# Patient Record
Sex: Female | Born: 1950 | Race: Black or African American | Hispanic: No | Marital: Single | Smoking: Never smoker
Health system: Southern US, Community
[De-identification: ages and names within clinical notes are randomized; demographics above are authoritative.]

## PROBLEM LIST (undated history)

## (undated) DIAGNOSIS — E785 Hyperlipidemia, unspecified: Secondary | ICD-10-CM

## (undated) DIAGNOSIS — M549 Dorsalgia, unspecified: Secondary | ICD-10-CM

## (undated) DIAGNOSIS — G8929 Other chronic pain: Secondary | ICD-10-CM

## (undated) HISTORY — DX: Hyperlipidemia, unspecified: E78.5

---

## 1998-07-14 ENCOUNTER — Inpatient Hospital Stay (HOSPITAL_COMMUNITY): Admission: RE | Admit: 1998-07-14 | Discharge: 1998-07-16 | Payer: Self-pay | Admitting: *Deleted

## 2001-01-12 ENCOUNTER — Encounter: Payer: Self-pay | Admitting: Orthopedic Surgery

## 2001-01-12 ENCOUNTER — Ambulatory Visit (HOSPITAL_COMMUNITY): Admission: RE | Admit: 2001-01-12 | Discharge: 2001-01-12 | Payer: Self-pay | Admitting: Orthopedic Surgery

## 2001-08-22 ENCOUNTER — Encounter: Admission: RE | Admit: 2001-08-22 | Discharge: 2001-08-22 | Payer: Self-pay | Admitting: Orthopedic Surgery

## 2001-08-22 ENCOUNTER — Encounter: Payer: Self-pay | Admitting: Orthopedic Surgery

## 2001-09-03 ENCOUNTER — Encounter: Payer: Self-pay | Admitting: Orthopedic Surgery

## 2001-09-03 ENCOUNTER — Encounter: Admission: RE | Admit: 2001-09-03 | Discharge: 2001-09-03 | Payer: Self-pay | Admitting: Orthopedic Surgery

## 2001-09-16 ENCOUNTER — Encounter: Admission: RE | Admit: 2001-09-16 | Discharge: 2001-09-16 | Payer: Self-pay | Admitting: Orthopedic Surgery

## 2001-09-16 ENCOUNTER — Encounter: Payer: Self-pay | Admitting: Orthopedic Surgery

## 2001-10-29 ENCOUNTER — Encounter: Payer: Self-pay | Admitting: Orthopedic Surgery

## 2001-10-29 ENCOUNTER — Ambulatory Visit (HOSPITAL_COMMUNITY): Admission: RE | Admit: 2001-10-29 | Discharge: 2001-10-29 | Payer: Self-pay | Admitting: Orthopedic Surgery

## 2003-04-10 ENCOUNTER — Ambulatory Visit (HOSPITAL_COMMUNITY): Admission: RE | Admit: 2003-04-10 | Discharge: 2003-04-10 | Payer: Self-pay | Admitting: Orthopedic Surgery

## 2005-07-18 ENCOUNTER — Encounter: Admission: RE | Admit: 2005-07-18 | Discharge: 2005-07-18 | Payer: Self-pay | Admitting: Orthopedic Surgery

## 2010-09-12 ENCOUNTER — Encounter: Admission: RE | Admit: 2010-09-12 | Discharge: 2010-09-12 | Payer: Self-pay | Admitting: Orthopedic Surgery

## 2011-01-15 ENCOUNTER — Encounter: Payer: Self-pay | Admitting: Nurse Practitioner

## 2011-01-15 ENCOUNTER — Encounter: Payer: Self-pay | Admitting: Orthopedic Surgery

## 2011-04-04 ENCOUNTER — Other Ambulatory Visit: Payer: Self-pay | Admitting: Orthopedic Surgery

## 2011-04-04 DIAGNOSIS — M549 Dorsalgia, unspecified: Secondary | ICD-10-CM

## 2011-04-04 DIAGNOSIS — M79605 Pain in left leg: Secondary | ICD-10-CM

## 2011-04-14 ENCOUNTER — Ambulatory Visit
Admission: RE | Admit: 2011-04-14 | Discharge: 2011-04-14 | Disposition: A | Discharge status: Home / Self Care | Payer: Medicare Other | Source: Ambulatory Visit | Attending: Orthopedic Surgery | Admitting: Orthopedic Surgery

## 2011-04-14 DIAGNOSIS — M79605 Pain in left leg: Secondary | ICD-10-CM

## 2011-04-14 DIAGNOSIS — M549 Dorsalgia, unspecified: Secondary | ICD-10-CM

## 2011-05-12 NOTE — Op Note (Signed)
Sherri Jones, Sherri Jones                          ACCOUNT NO.:  [id number]   MEDICAL RECORD NO.:  [id number]                   PATIENT TYPE:  AMB   LOCATION:  DAY                                  FACILITY:  [facility]   PHYSICIAN:  Ronald A. Darrelyn Hillock, M.D.             DATE OF BIRTH:  03/13/1951   DATE OF PROCEDURE:  04/10/2003  DATE OF DISCHARGE:                                 OPERATIVE REPORT   PREOPERATIVE DIAGNOSES:  1. Partial tear of the medial meniscus -- left knee.  2. Chronic synovitis -- left knee.   POSTOPERATIVE DIAGNOSES:  1. Partial tear of the medial meniscus -- left knee.  2. Chronic synovitis -- left knee.   OPERATION:  1. Diagnostic arthroscopy -- left knee.  2. Partial medial meniscectomy -- left knee.  3. Synovectomy of the medial compartment -- left knee.  4. Synovectomy of suprapatellar compartment -- left knee.   SURGEON:  Georges Lynch. Darrelyn Hillock, M.D.   ASSISTANT:  Nurse.   DESCRIPTION OF PROCEDURE:  Under general anesthesia, routine orthopedic  prepping and draping of left lower extremity was carried out.  A small  punctate incision was made in the suprapatellar pouch; in-flow cannula was  inserted and the knee was distended with saline.  At this time another small  punctate incision was made in the anterolateral joint; the arthroscope was  entered and complete diagnostic arthroscopy was carried out.  The patient's  main problem was a severe chronic synovitis in her knee.  The second problem  was she had a vertical tear of the medial meniscus.  I introduced the shaver  suction device in the medial approach, and did a partial medial  meniscectomy.  Following that I did a synovectomy of the medial compartment,  as well as the suprapatellar pouch region.  The inferior and posterior  cruciate ligaments were intact.  The lateral meniscus was definitely intact.  I thoroughly irrigated out; the area of the joint spaces themselves did not  look bad, except for some mild  irregularities in the medial joint.  Her  problem was more of a chronic synovitis, as well as the meniscal tear.  I  thoroughly irrigated out the knee and removed the fluid.  I closed all three  punctate incisions with 3-0 nylon suture, and injected with 30 cc 0.5%  Marcaine with epinephrine into the knee joint.  Given 1 g IV Ancef  preoperatively, and during the surgery she had 30 mg of IV Toradol.  Sterile  dressings were applied.  The patient left the operating room in satisfactory  condition.   FOLLOW-UP CARE:  She will be on Mepergan Fortis at home for pain, one q.4h.  p.r.n.  She will be on Bufferin one b.i.d. as an anticoagulant.  She will be  on crutches with partial weightbearing.  I will see her in 12-14 days, or  prior to if she has any problems.  Ronald A. Darrelyn Hillock, M.D.   RAG/MEDQ  D:  04/10/2003  T:  04/10/2003  Job:  [id number]

## 2012-12-13 ENCOUNTER — Other Ambulatory Visit: Payer: Self-pay | Admitting: Orthopedic Surgery

## 2012-12-13 DIAGNOSIS — M4800 Spinal stenosis, site unspecified: Secondary | ICD-10-CM

## 2012-12-23 ENCOUNTER — Other Ambulatory Visit: Payer: Self-pay | Admitting: Orthopedic Surgery

## 2012-12-23 ENCOUNTER — Ambulatory Visit
Admission: RE | Admit: 2012-12-23 | Discharge: 2012-12-23 | Disposition: A | Discharge status: Home / Self Care | Payer: Medicare Other | Source: Ambulatory Visit | Attending: Orthopedic Surgery | Admitting: Orthopedic Surgery

## 2012-12-23 ENCOUNTER — Ambulatory Visit
Admission: RE | Admit: 2012-12-23 | Discharge: 2012-12-23 | Disposition: A | Discharge status: Home / Self Care | Payer: Self-pay | Source: Ambulatory Visit | Attending: Orthopedic Surgery | Admitting: Orthopedic Surgery

## 2012-12-23 VITALS — BP 116/72 | HR 65 | Ht 71.0 in | Wt 194.0 lb

## 2012-12-23 DIAGNOSIS — R52 Pain, unspecified: Secondary | ICD-10-CM

## 2012-12-23 DIAGNOSIS — M4800 Spinal stenosis, site unspecified: Secondary | ICD-10-CM

## 2012-12-23 MED ORDER — DIAZEPAM 5 MG PO TABS
10.0000 mg | ORAL_TABLET | Freq: Once | ORAL | Status: AC
  Administered 2012-12-23: 10 mg via ORAL

## 2012-12-23 MED ORDER — IOHEXOL 180 MG/ML  SOLN
18.0000 mL | Freq: Once | INTRAMUSCULAR | Status: AC | PRN
  Administered 2012-12-23: 18 mL via INTRATHECAL

## 2012-12-23 MED ORDER — ONDANSETRON HCL 4 MG/2ML IJ SOLN: 4.0000 mg | Freq: Four times a day (QID) | INTRAMUSCULAR | Status: DC | PRN

## 2017-03-28 ENCOUNTER — Encounter (HOSPITAL_COMMUNITY): Payer: Self-pay

## 2017-03-28 ENCOUNTER — Emergency Department (HOSPITAL_COMMUNITY)
Admission: EM | Admit: 2017-03-28 | Discharge: 2017-03-28 | Disposition: A | Discharge status: Home / Self Care | Payer: Medicare Other | Attending: Emergency Medicine | Admitting: Emergency Medicine

## 2017-03-28 DIAGNOSIS — M545 Low back pain: Secondary | ICD-10-CM | POA: Diagnosis not present

## 2017-03-28 DIAGNOSIS — M549 Dorsalgia, unspecified: Secondary | ICD-10-CM | POA: Diagnosis present

## 2017-03-28 DIAGNOSIS — M5410 Radiculopathy, site unspecified: Secondary | ICD-10-CM

## 2017-03-28 HISTORY — DX: Other chronic pain: G89.29

## 2017-03-28 HISTORY — DX: Dorsalgia, unspecified: M54.9

## 2017-03-28 MED ORDER — KETOROLAC TROMETHAMINE 60 MG/2ML IM SOLN
30.0000 mg | Freq: Once | INTRAMUSCULAR | Status: AC
  Administered 2017-03-28: 30 mg via INTRAMUSCULAR
  Filled 2017-03-28: qty 2

## 2017-03-28 MED ORDER — CYCLOBENZAPRINE HCL 10 MG PO TABS: 10.0000 mg | ORAL_TABLET | Freq: Two times a day (BID) | ORAL | 0 refills | Status: DC | PRN

## 2017-03-28 MED ORDER — PREDNISONE 20 MG PO TABS: ORAL_TABLET | ORAL | 0 refills | Status: DC

## 2017-03-28 MED ORDER — NAPROXEN 500 MG PO TABS: 500.0000 mg | ORAL_TABLET | Freq: Two times a day (BID) | ORAL | 0 refills | Status: DC

## 2017-03-28 MED ORDER — PREDNISONE 20 MG PO TABS
60.0000 mg | ORAL_TABLET | Freq: Once | ORAL | Status: AC
  Administered 2017-03-28: 60 mg via ORAL
  Filled 2017-03-28: qty 3

## 2017-03-28 NOTE — ED Notes (Signed)
Pt ambulated with slow but steady gait

## 2017-03-28 NOTE — ED Provider Notes (Signed)
[facility]-EMERGENCY DEPT Provider Note   CSN: [id number] Arrival date & time: 03/28/17  1956   By signing my name below, I, Clovis Pu, attest that this documentation has been prepared under the direction and in the presence of  Fayrene Helper, PA-C. Electronically Signed: Clovis Pu, ED Scribe. 03/28/17. 9:04 PM.   History   Chief Complaint Chief Complaint  Patient presents with  . Back Pain    HPI Comments:  Sherri Jones is a 66 y.o. female, with a PMHx of spinal stenosis, who presents to the Emergency Department complaining of chronic, recurrent, moderate back pain (R>L) which radiates to her right knee x yesterday. She states her pain worsened today. She states her pain is exacerbated with sitting down and upon palpation. She notes she experiences these symptoms about twice a month and notes she usually takes OTC pain medication for her symptoms. She notes her orthopedist has treated her, in the past, with muscle relaxer but notes they make her nauseous. Pt denies fevers, chills, bowel/bladder incontinence, difficulty urinating, dysuria, hematuria, numbness, weakness or any other associated symptoms. No other complaints noted at this time.   Orthopedist: Dr. Ranee Gosselin   The history is provided by the patient. No language interpreter was used.    Past Medical History:  Diagnosis Date  . Chronic back pain     There are no active problems to display for this patient.   History reviewed. No pertinent surgical history.  OB History    No data available       Home Medications    Prior to Admission medications   Not on File    Family History No family history on file.  Social History Social History  Substance Use Topics  . Smoking status: Never Smoker  . Smokeless tobacco: Never Used  . Alcohol use No     Allergies   Patient has no known allergies.   Review of Systems Review of Systems  Constitutional: Negative for chills and fever.  Genitourinary:  Negative for difficulty urinating, dysuria and hematuria.  Musculoskeletal: Positive for back pain and myalgias.  Neurological: Negative for weakness and numbness.     Physical Exam Updated Vital Signs BP (!) 134/99 (BP Location: Right Arm)   Pulse 84   Temp 97.7 F (36.5 C) (Oral)   Resp 16   SpO2 100%   Physical Exam  Constitutional: She is oriented to person, place, and time. She appears well-developed and well-nourished. No distress.  HENT:  Head: Normocephalic and atraumatic.  Eyes: Conjunctivae are normal.  Cardiovascular: Normal rate.   Pulmonary/Chest: Effort normal.  Abdominal: Soft. She exhibits no distension. There is no tenderness.  Musculoskeletal:  Tenderness noted to the right paral lumbar spinal muscle upon palpation. Positive right straight leg raise.   Neurological: She is alert and oriented to person, place, and time.  Skin: Skin is warm and dry.  Psychiatric: She has a normal mood and affect.  Nursing note and vitals reviewed.    ED Treatments / Results  DIAGNOSTIC STUDIES:  Oxygen Saturation is 100% on RA, normal by my interpretation.    COORDINATION OF CARE:  8:50 PM Discussed treatment plan with pt at bedside and pt agreed to plan.  Labs (all labs ordered are listed, but only abnormal results are displayed) Labs Reviewed - No data to display  EKG  EKG Interpretation None       Radiology No results found.  Procedures Procedures (including critical care time)  Medications Ordered in ED  Medications - No data to display   Initial Impression / Assessment and Plan / ED Course  I have reviewed the triage vital signs and the nursing notes.  Pertinent labs & imaging results that were available during my care of the patient were reviewed by me and considered in my medical decision making (see chart for details).     Patient with back pain.  No neurological deficits and normal neuro exam.  Patient is ambulatory.  No loss of bowel or  bladder control.  No concern for cauda equina.  No fever, night sweats, weight loss, h/o cancer, IVDA, no recent procedure to back. No urinary symptoms suggestive of UTI.  Supportive care and return precaution discussed. Appears safe for discharge at this time. Follow up as indicated in discharge paperwork.   Final Clinical Impressions(s) / ED Diagnoses   Final diagnoses:  Radicular low back pain    New Prescriptions New Prescriptions   CYCLOBENZAPRINE (FLEXERIL) 10 MG TABLET    Take 1 tablet (10 mg total) by mouth 2 (two) times daily as needed for muscle spasms.   NAPROXEN (NAPROSYN) 500 MG TABLET    Take 1 tablet (500 mg total) by mouth 2 (two) times daily.   PREDNISONE (DELTASONE) 20 MG TABLET    2 tabs po daily x 4 days   I personally performed the services described in this documentation, which was scribed in my presence. The recorded information has been reviewed and is accurate.       Fayrene Helper, PA-C 03/28/17 2139    Nira Conn, MD 03/29/17 [phone]

## 2017-03-28 NOTE — ED Triage Notes (Signed)
Pt states she has hx of back pain but Md she normally see is out of town; pt states she started having back pain yesterday but has increased; pt c/o pain at 10/10 and radiates down right leg; Pt a&ox 4 and ambulated to triage room

## 2017-06-03 ENCOUNTER — Emergency Department (HOSPITAL_COMMUNITY)
Admission: EM | Admit: 2017-06-03 | Discharge: 2017-06-03 | Disposition: A | Discharge status: Home / Self Care | Payer: Medicare Other | Attending: Emergency Medicine | Admitting: Emergency Medicine

## 2017-06-03 ENCOUNTER — Encounter (HOSPITAL_COMMUNITY): Payer: Self-pay | Admitting: *Deleted

## 2017-06-03 DIAGNOSIS — Z79899 Other long term (current) drug therapy: Secondary | ICD-10-CM | POA: Insufficient documentation

## 2017-06-03 DIAGNOSIS — Y999 Unspecified external cause status: Secondary | ICD-10-CM | POA: Insufficient documentation

## 2017-06-03 DIAGNOSIS — Y9389 Activity, other specified: Secondary | ICD-10-CM | POA: Diagnosis not present

## 2017-06-03 DIAGNOSIS — M542 Cervicalgia: Secondary | ICD-10-CM | POA: Insufficient documentation

## 2017-06-03 DIAGNOSIS — G8929 Other chronic pain: Secondary | ICD-10-CM | POA: Insufficient documentation

## 2017-06-03 DIAGNOSIS — Y92410 Unspecified street and highway as the place of occurrence of the external cause: Secondary | ICD-10-CM | POA: Diagnosis not present

## 2017-06-03 DIAGNOSIS — M549 Dorsalgia, unspecified: Secondary | ICD-10-CM | POA: Insufficient documentation

## 2017-06-03 MED ORDER — METHOCARBAMOL 500 MG PO TABS: 500.0000 mg | ORAL_TABLET | Freq: Two times a day (BID) | ORAL | 0 refills | Status: DC

## 2017-06-03 MED ORDER — KETOROLAC TROMETHAMINE 60 MG/2ML IM SOLN
60.0000 mg | Freq: Once | INTRAMUSCULAR | Status: AC
  Administered 2017-06-03: 60 mg via INTRAMUSCULAR
  Filled 2017-06-03: qty 2

## 2017-06-03 MED ORDER — METHOCARBAMOL 500 MG PO TABS
1000.0000 mg | ORAL_TABLET | Freq: Once | ORAL | Status: AC
  Administered 2017-06-03: 1000 mg via ORAL
  Filled 2017-06-03: qty 2

## 2017-06-03 MED ORDER — IBUPROFEN 600 MG PO TABS: 600.0000 mg | ORAL_TABLET | Freq: Four times a day (QID) | ORAL | 0 refills | Status: DC | PRN

## 2017-06-03 MED ORDER — LIDOCAINE 5 % EX PTCH: 1.0000 | MEDICATED_PATCH | CUTANEOUS | 0 refills | Status: DC

## 2017-06-03 NOTE — ED Notes (Signed)
Pt verbalized understanding discharge instructions and denies any further needs or questions at this time. VS stable, ambulatory and steady gait.   
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
## 2017-06-03 NOTE — Discharge Instructions (Signed)
Expect your soreness to increase over the next 2-3 days. Take it easy, but do not lay around too much as this may make any stiffness worse.  °Antiinflammatory medications: Take 600 mg of ibuprofen every 6 hours or 440 mg (over the counter dose) to 500 mg (prescription dose) of naproxen every 12 hours or for the next 3 days. After this time, these medications may be used as needed for pain. Take these medications with food to avoid upset stomach. Choose only one of these medications, do not take them together.  ° °Muscle relaxer: Robaxin is a muscle relaxer and may help loosen stiff muscles. Do not take the Robaxin while driving or performing other dangerous activities.  ° °Lidocaine patches: These are available via either prescription or over-the-counter. The over-the-counter option may be more economical one and are likely just as effective. There are multiple over-the-counter brands, such as Salonpas. ° °Exercises: Be sure to perform the attached exercises starting with three times a week and working up to performing them daily. This is an essential part of preventing long term problems.  ° °Follow up with a primary care provider for any future management of these complaints. °

## 2017-08-21 ENCOUNTER — Encounter (HOSPITAL_COMMUNITY): Payer: Self-pay | Admitting: Emergency Medicine

## 2017-08-21 DIAGNOSIS — R42 Dizziness and giddiness: Secondary | ICD-10-CM | POA: Diagnosis present

## 2017-08-21 DIAGNOSIS — R61 Generalized hyperhidrosis: Secondary | ICD-10-CM | POA: Diagnosis not present

## 2017-08-21 DIAGNOSIS — R5383 Other fatigue: Secondary | ICD-10-CM | POA: Diagnosis not present

## 2017-08-21 LAB — BASIC METABOLIC PANEL
Anion gap: 10 (ref 5–15)
BUN: 11 mg/dL (ref 6–20)
CHLORIDE: 108 mmol/L (ref 101–111)
CO2: 23 mmol/L (ref 22–32)
Calcium: 9.1 mg/dL (ref 8.9–10.3)
Creatinine, Ser: 0.83 mg/dL (ref 0.44–1.00)
GFR calc Af Amer: >60 mL/min (ref >60)
GFR calc non Af Amer: >60 mL/min (ref >60)
GLUCOSE: 104 mg/dL — AB (ref 65–99)
Potassium: 3.8 mmol/L (ref 3.5–5.1)
Sodium: 141 mmol/L (ref 135–145)

## 2017-08-21 LAB — CBC
HEMATOCRIT: 35.6 % — AB (ref 36.0–46.0)
HEMOGLOBIN: 11.9 g/dL — AB (ref 12.0–15.0)
MCH: 30.2 pg (ref 26.0–34.0)
MCHC: 33.4 g/dL (ref 30.0–36.0)
MCV: 90.4 fL (ref 78.0–100.0)
Platelets: 250 10*3/uL (ref 150–400)
RBC: 3.94 MIL/uL (ref 3.87–5.11)
RDW: 12.7 % (ref 11.5–15.5)
WBC: 8.3 10*3/uL (ref 4.0–10.5)

## 2017-08-21 LAB — URINALYSIS, ROUTINE W REFLEX MICROSCOPIC
BACTERIA UA: NONE SEEN
Bilirubin Urine: NEGATIVE
GLUCOSE, UA: NEGATIVE mg/dL
KETONES UR: NEGATIVE mg/dL
Leukocytes, UA: NEGATIVE
Nitrite: NEGATIVE
PROTEIN: NEGATIVE mg/dL
Specific Gravity, Urine: 1.013 (ref 1.005–1.030)
pH: 6 (ref 5.0–8.0)

## 2017-08-21 NOTE — ED Triage Notes (Signed)
Pt presents with ongoing feeling of fatigue and dizziness for about 4 wks; pt states also that she will spontaneously begin sweating for no reason; pt denies pain, n/v or any syncope

## 2017-08-22 ENCOUNTER — Emergency Department (HOSPITAL_COMMUNITY): Payer: Medicare Other

## 2017-08-22 ENCOUNTER — Emergency Department (HOSPITAL_COMMUNITY)
Admission: EM | Admit: 2017-08-22 | Discharge: 2017-08-22 | Disposition: A | Discharge status: Home / Self Care | Payer: Medicare Other | Attending: Emergency Medicine | Admitting: Emergency Medicine

## 2017-08-22 DIAGNOSIS — R42 Dizziness and giddiness: Secondary | ICD-10-CM

## 2017-08-22 LAB — HEPATIC FUNCTION PANEL
ALBUMIN: 3.5 g/dL (ref 3.5–5.0)
ALT: 22 U/L (ref 14–54)
AST: 20 U/L (ref 15–41)
Alkaline Phosphatase: 97 U/L (ref 38–126)
BILIRUBIN INDIRECT: 0.6 mg/dL (ref 0.3–0.9)
Bilirubin, Direct: 0.1 mg/dL (ref 0.1–0.5)
TOTAL PROTEIN: 6.3 g/dL — AB (ref 6.5–8.1)
Total Bilirubin: 0.7 mg/dL (ref 0.3–1.2)

## 2017-08-22 LAB — I-STAT TROPONIN, ED: Troponin i, poc: 0 ng/mL (ref 0.00–0.08)

## 2017-08-22 LAB — POC OCCULT BLOOD, ED: FECAL OCCULT BLD: NEGATIVE

## 2017-08-22 MED ORDER — MECLIZINE HCL 25 MG PO TABS
50.0000 mg | ORAL_TABLET | Freq: Once | ORAL | Status: AC
  Administered 2017-08-22: 50 mg via ORAL
  Filled 2017-08-22: qty 2

## 2017-08-22 MED ORDER — MECLIZINE HCL 25 MG PO TABS: 25.0000 mg | ORAL_TABLET | Freq: Three times a day (TID) | ORAL | 0 refills | Status: DC | PRN

## 2017-08-22 NOTE — ED Provider Notes (Signed)
[facility]PT Provider Note   CSN: [id number] Arrival date & time: 08/21/17  2152     History   Chief Complaint Chief Complaint  Patient presents with  . Fatigue  . Dizziness  . Diaphoresis    HPI Sherri Jones is a 66 y.o. female with no sig PMH, here with fatigue and weakness x 1 month.  She states this has not happened to her before.  She denies any pain anywhere.  She lays in bed all day and can not do her household chores. She feels as if she has no energy.  She states her appetite has been poor recently as well, but states it has always been somewhat poor. She had one episode of vomiting during this.  She developed dizziness 2 days ago that is worse when she lays down. Denies history of vertigo. She denies neuro symptoms of weakness or numbness. There are no further complaints.  10 Systems reviewed and are negative for acute change except as noted in the HPI.   HPI  Past Medical History:  Diagnosis Date  . Chronic back pain     There are no active problems to display for this patient.   History reviewed. No pertinent surgical history.  OB History    No data available       Home Medications    Prior to Admission medications   Medication Sig Start Date End Date Taking? Authorizing Provider  meclizine (ANTIVERT) 25 MG tablet Take 1 tablet (25 mg total) by mouth 3 (three) times daily as needed for dizziness. 08/22/17   Tomasita Crumble, MD    Family History History reviewed. No pertinent family history.  Social History Social History  Substance Use Topics  . Smoking status: Never Smoker  . Smokeless tobacco: Never Used  . Alcohol use No     Allergies   Patient has no known allergies.   Review of Systems Review of Systems   Physical Exam Updated Vital Signs BP (!) 134/93   Pulse 68   Temp 98.5 F (36.9 C) (Oral)   Resp 14   Ht 5\' 11"  (1.803 m)   Wt 89.4 kg (197 lb)   SpO2 99%   BMI 27.48 kg/m   Physical Exam  Constitutional: She is  oriented to person, place, and time. She appears well-developed and well-nourished. No distress.  HENT:  Head: Normocephalic and atraumatic.  Nose: Nose normal.  Mouth/Throat: Oropharynx is clear and moist. No oropharyngeal exudate.  Eyes: Pupils are equal, round, and reactive to light. Conjunctivae and EOM are normal. No scleral icterus.  Neck: Normal range of motion. Neck supple. No JVD present. No tracheal deviation present. No thyromegaly present.  Cardiovascular: Normal rate, regular rhythm and normal heart sounds.  Exam reveals no gallop and no friction rub.   No murmur heard. Pulmonary/Chest: Effort normal and breath sounds normal. No respiratory distress. She has no wheezes. She exhibits no tenderness.  Abdominal: Soft. Bowel sounds are normal. She exhibits no distension and no mass. There is no tenderness. There is no rebound and no guarding.  Musculoskeletal: Normal range of motion. She exhibits no edema or tenderness.  Lymphadenopathy:    She has no cervical adenopathy.  Neurological: She is alert and oriented to person, place, and time. No cranial nerve deficit. She exhibits normal muscle tone.  Normal strength and sensation in all extremities. Normal cerebellar testing.    Skin: Skin is warm and dry. No rash noted. No erythema. No pallor.  Nursing note  and vitals reviewed.    ED Treatments / Results  Labs (all labs ordered are listed, but only abnormal results are displayed) Labs Reviewed  BASIC METABOLIC PANEL - Abnormal; Notable for the following:       Result Value   Glucose, Bld 104 (*)    All other components within normal limits  CBC - Abnormal; Notable for the following:    Hemoglobin 11.9 (*)    HCT 35.6 (*)    All other components within normal limits  URINALYSIS, ROUTINE W REFLEX MICROSCOPIC - Abnormal; Notable for the following:    Color, Urine STRAW (*)    Hgb urine dipstick MODERATE (*)    Squamous Epithelial / LPF 0-5 (*)    All other components within  normal limits  HEPATIC FUNCTION PANEL - Abnormal; Notable for the following:    Total Protein 6.3 (*)    All other components within normal limits  URINE CULTURE  I-STAT TROPONIN, ED  CBG MONITORING, ED  POC OCCULT BLOOD, ED    EKG  EKG Interpretation  Date/Time:  Tuesday August 21 2017 22:02:34 EDT Ventricular Rate:  75 PR Interval:  162 QRS Duration: 76 QT Interval:  400 QTC Calculation: 446 R Axis:   29 Text Interpretation:  Normal sinus rhythm with sinus arrhythmia Low voltage QRS Borderline ECG Rate faster Confirmed by Erroll Lunani, Hakiem Malizia Ayokunle [phone]([id number]) on 08/22/2017 1:42:23 AM       Radiology Dg Chest 2 View  Result Date: 08/22/2017 CLINICAL DATA:  Fatigue and dizziness for 4 weeks. EXAM: CHEST  2 VIEW COMPARISON:  CT angiogram of the chest April 14, 2017. FINDINGS: Cardiomediastinal silhouette is normal. Strandy densities LEFT lung base. No pleural effusions or focal consolidations. Trachea projects midline and there is no pneumothorax. Soft tissue planes and included osseous structures are non-suspicious. Degenerative change of the thoracic spine. IMPRESSION: LEFT lung base atelectasis Electronically Signed   By: Awilda Metroourtnay  Bloomer M.D.   On: 08/22/2017 03:27    Procedures Procedures (including critical care time)  Medications Ordered in ED Medications  meclizine (ANTIVERT) tablet 50 mg (50 mg Oral Given 08/22/17 0330)     Initial Impression / Assessment and Plan / ED Course  I have reviewed the triage vital signs and the nursing notes.  Pertinent labs & imaging results that were available during my care of the patient were reviewed by me and considered in my medical decision making (see chart for details).     Patient presents to the ED for weakness and dizziness. This has been going on for 1 month, I do not believe it is an acute stroke.  Neuro exam is otherwise normal.  Will obtain labs, infectious work up.  Labs and imaging are unremarkable.  Meclizine did help  with her dizziness.  She will be given 10 tabs to take as needed at home.  PCP fu advised within 3 days. She appears well and in NAD.  VS remain within her normal limits and she is safe for DC.  Final Clinical Impressions(s) / ED Diagnoses   Final diagnoses:  Dizziness    New Prescriptions New Prescriptions   MECLIZINE (ANTIVERT) 25 MG TABLET    Take 1 tablet (25 mg total) by mouth 3 (three) times daily as needed for dizziness.     Tomasita Crumbleni, Dayvian Blixt, MD 08/22/17 [phone]

## 2017-08-23 LAB — URINE CULTURE: Culture: <10000 — AB

## 2018-02-24 ENCOUNTER — Emergency Department (HOSPITAL_COMMUNITY)
Admission: EM | Admit: 2018-02-24 | Discharge: 2018-02-24 | Disposition: A | Discharge status: Home / Self Care | Payer: Medicare Other | Attending: Emergency Medicine | Admitting: Emergency Medicine

## 2018-02-24 ENCOUNTER — Other Ambulatory Visit: Payer: Self-pay

## 2018-02-24 ENCOUNTER — Encounter (HOSPITAL_COMMUNITY): Payer: Self-pay | Admitting: Emergency Medicine

## 2018-02-24 DIAGNOSIS — R110 Nausea: Secondary | ICD-10-CM | POA: Insufficient documentation

## 2018-02-24 DIAGNOSIS — R42 Dizziness and giddiness: Secondary | ICD-10-CM | POA: Insufficient documentation

## 2018-02-24 MED ORDER — PROMETHAZINE HCL 25 MG PO TABS
12.5000 mg | ORAL_TABLET | Freq: Once | ORAL | Status: AC
  Administered 2018-02-24: 12.5 mg via ORAL
  Filled 2018-02-24: qty 1

## 2018-02-24 MED ORDER — AZITHROMYCIN 250 MG PO TABS: 250.0000 mg | ORAL_TABLET | Freq: Every day | ORAL | 0 refills | Status: DC

## 2018-02-24 MED ORDER — DIAZEPAM 5 MG PO TABS: 2.5000 mg | ORAL_TABLET | Freq: Three times a day (TID) | ORAL | 0 refills | Status: DC | PRN

## 2018-02-24 MED ORDER — FLUTICASONE PROPIONATE 50 MCG/ACT NA SUSP: 2.0000 | Freq: Every day | NASAL | 0 refills | Status: DC

## 2018-02-24 MED ORDER — MECLIZINE HCL 25 MG PO TABS
12.5000 mg | ORAL_TABLET | Freq: Once | ORAL | Status: AC
  Administered 2018-02-24: 12.5 mg via ORAL
  Filled 2018-02-24: qty 1

## 2018-02-24 MED ORDER — LORAZEPAM 1 MG PO TABS
0.5000 mg | ORAL_TABLET | Freq: Once | ORAL | Status: AC
  Administered 2018-02-24: 0.5 mg via ORAL
  Filled 2018-02-24: qty 1

## 2018-02-24 NOTE — ED Triage Notes (Signed)
Pt in from home via [facility] with dizziness that began at 0130 this am, after getting up from bed. Pt denies any syncope or LOC, states she fell back onto bed. Also c/o HA since 0130. Denies any vision changes, MAE's equally, ambulated for EMS. Hx of same dizziness 6 mo's ago r/t "sinus infection" per pt. BP 154/78 en route

## 2018-02-24 NOTE — ED Notes (Signed)
Pt asking if she can get an antibiotic because she believes she has a sinus infection. Dr Juleen ChinaKohut notified, per EDP will write prescription for congestion

## 2018-02-24 NOTE — ED Notes (Signed)
Pt verbalized understanding of all d/c instructions and prescriptions. VSS. Wheeled to lobby by this [employer] fundraiser

## 2018-02-25 NOTE — ED Provider Notes (Signed)
[facility] EMERGENCY DEPARTMENT Provider Note   CSN: [id number] Arrival date & time: 02/24/18  0854     History   Chief Complaint Chief Complaint  Patient presents with  . Dizziness    HPI Sherri Jones is a 67 y.o. female.  HPI   66yF with dizziness. Noticed when got from bed around 0130 today. Worse with movement. Room spinning sensation. Nauseated. Very off balance. Denies ha or other pain. No visual changes. No change in speech. No acute numbness, tingling or loss of strength.   Past Medical History:  Diagnosis Date  . Chronic back pain     There are no active problems to display for this patient.   History reviewed. No pertinent surgical history.  OB History    No data available       Home Medications    Prior to Admission medications   Medication Sig Start Date End Date Taking? Authorizing Provider  azithromycin (ZITHROMAX) 250 MG tablet Take 1 tablet (250 mg total) by mouth daily. Take first 2 tablets together, then 1 every day until finished. 02/24/18   Raeford RazorKohut, Andersson Larrabee, MD  diazepam (VALIUM) 5 MG tablet Take 0.5 tablets (2.5 mg total) by mouth every 8 (eight) hours as needed (vertigo). 02/24/18   Raeford RazorKohut, Powell Halbert, MD  fluticasone (FLONASE) 50 MCG/ACT nasal spray Place 2 sprays into both nostrils daily. 02/24/18   Raeford RazorKohut, Rylin Saez, MD  meclizine (ANTIVERT) 25 MG tablet Take 1 tablet (25 mg total) by mouth 3 (three) times daily as needed for dizziness. 08/22/17   Tomasita Crumbleni, Adeleke, MD    Family History No family history on file.  Social History Social History   Tobacco Use  . Smoking status: Never Smoker  . Smokeless tobacco: Never Used  Substance Use Topics  . Alcohol use: No  . Drug use: Not on file     Allergies   Patient has no known allergies.   Review of Systems Review of Systems  All systems reviewed and negative, other than as noted in HPI.  Physical Exam Updated Vital Signs BP (!) 149/89   Pulse 64   Temp 97.9 F (36.6 C)    Resp 13   Ht 5\' 11"  (1.803 m)   Wt 89.4 kg (197 lb)   SpO2 99%   BMI 27.48 kg/m   Physical Exam  Constitutional: She is oriented to person, place, and time. She appears well-developed and well-nourished. No distress.  HENT:  Head: Normocephalic and atraumatic.  Eyes: Conjunctivae are normal. Right eye exhibits no discharge. Left eye exhibits no discharge.  Neck: Neck supple.  Cardiovascular: Normal rate, regular rhythm and normal heart sounds. Exam reveals no gallop and no friction rub.  No murmur heard. Pulmonary/Chest: Effort normal and breath sounds normal. No respiratory distress.  Abdominal: Soft. She exhibits no distension. There is no tenderness.  Musculoskeletal: She exhibits no edema or tenderness.  Neurological: She is alert and oriented to person, place, and time. No cranial nerve deficit. She exhibits normal muscle tone. Coordination normal.  Good finger to nose b/l.   Skin: Skin is warm and dry.  Psychiatric: She has a normal mood and affect. Her behavior is normal. Thought content normal.  Nursing note and vitals reviewed.    ED Treatments / Results  Labs (all labs ordered are listed, but only abnormal results are displayed) Labs Reviewed - No data to display  EKG  EKG Interpretation  Date/Time:  Sunday February 24 2018 09:00:04 EST Ventricular Rate:  74  PR Interval:    QRS Duration: 56 QT Interval:  595 QTC Calculation: 661 R Axis:   2 Text Interpretation:  Sinus rhythm Low voltage, precordial leads Borderline T abnormalities, diffuse leads Prolonged QT interval Confirmed by Raeford Razor [phone]) on 02/24/2018 9:19:42 AM       Radiology No results found.  Procedures Procedures (including critical care time)  Medications Ordered in ED Medications  meclizine (ANTIVERT) tablet 12.5 mg (12.5 mg Oral Given 02/24/18 1039)  LORazepam (ATIVAN) tablet 0.5 mg (0.5 mg Oral Given 02/24/18 1039)  promethazine (PHENERGAN) tablet 12.5 mg (12.5 mg Oral Given 02/24/18  1039)     Initial Impression / Assessment and Plan / ED Course  I have reviewed the triage vital signs and the nursing notes.  Pertinent labs & imaging results that were available during my care of the patient were reviewed by me and considered in my medical decision making (see chart for details).     Likely peripheral vertigo. Doubt central etiology. Prn meclizine. Return precautions discussed.   Final Clinical Impressions(s) / ED Diagnoses   Final diagnoses:  Vertigo    ED Discharge Orders        Ordered    diazepam (VALIUM) 5 MG tablet  Every 8 hours PRN     02/24/18 1105    fluticasone (FLONASE) 50 MCG/ACT nasal spray  Daily     02/24/18 1120    azithromycin (ZITHROMAX) 250 MG tablet  Daily     02/24/18 1120       Raeford Razor, MD 02/25/18 1629

## 2018-03-13 ENCOUNTER — Other Ambulatory Visit: Payer: Self-pay | Admitting: Nurse Practitioner

## 2018-03-13 DIAGNOSIS — Z1231 Encounter for screening mammogram for malignant neoplasm of breast: Secondary | ICD-10-CM

## 2018-04-05 ENCOUNTER — Ambulatory Visit: Payer: Medicare Other

## 2018-09-14 ENCOUNTER — Emergency Department (HOSPITAL_COMMUNITY)
Admission: EM | Admit: 2018-09-14 | Discharge: 2018-09-14 | Disposition: A | Discharge status: Home / Self Care | Payer: Medicare Other | Attending: Emergency Medicine | Admitting: Emergency Medicine

## 2018-09-14 ENCOUNTER — Emergency Department (HOSPITAL_COMMUNITY): Payer: Medicare Other

## 2018-09-14 ENCOUNTER — Encounter (HOSPITAL_COMMUNITY): Payer: Self-pay | Admitting: Emergency Medicine

## 2018-09-14 ENCOUNTER — Other Ambulatory Visit: Payer: Self-pay

## 2018-09-14 DIAGNOSIS — R109 Unspecified abdominal pain: Secondary | ICD-10-CM | POA: Diagnosis present

## 2018-09-14 DIAGNOSIS — K429 Umbilical hernia without obstruction or gangrene: Secondary | ICD-10-CM | POA: Diagnosis not present

## 2018-09-14 DIAGNOSIS — Z79899 Other long term (current) drug therapy: Secondary | ICD-10-CM | POA: Diagnosis not present

## 2018-09-14 LAB — COMPREHENSIVE METABOLIC PANEL
ALBUMIN: 3.7 g/dL (ref 3.5–5.0)
ALK PHOS: 103 U/L (ref 38–126)
ALT: 17 U/L (ref 0–44)
ANION GAP: 10 (ref 5–15)
AST: 17 U/L (ref 15–41)
BUN: 8 mg/dL (ref 8–23)
CALCIUM: 8.9 mg/dL (ref 8.9–10.3)
CO2: 23 mmol/L (ref 22–32)
Chloride: 106 mmol/L (ref 98–111)
Creatinine, Ser: 0.86 mg/dL (ref 0.44–1.00)
GFR calc Af Amer: >60 mL/min (ref >60)
GFR calc non Af Amer: >60 mL/min (ref >60)
Glucose, Bld: 106 mg/dL — ABNORMAL HIGH (ref 70–99)
Potassium: 3.2 mmol/L — ABNORMAL LOW (ref 3.5–5.1)
SODIUM: 139 mmol/L (ref 135–145)
Total Bilirubin: 0.7 mg/dL (ref 0.3–1.2)
Total Protein: 6.5 g/dL (ref 6.5–8.1)

## 2018-09-14 LAB — URINALYSIS, ROUTINE W REFLEX MICROSCOPIC
Bacteria, UA: NONE SEEN
Bilirubin Urine: NEGATIVE
GLUCOSE, UA: NEGATIVE mg/dL
KETONES UR: NEGATIVE mg/dL
Leukocytes, UA: NEGATIVE
Nitrite: NEGATIVE
PROTEIN: NEGATIVE mg/dL
Specific Gravity, Urine: 1.015 (ref 1.005–1.030)
pH: 7 (ref 5.0–8.0)

## 2018-09-14 LAB — CBC
HCT: 39.8 % (ref 36.0–46.0)
HEMOGLOBIN: 13.3 g/dL (ref 12.0–15.0)
MCH: 30.4 pg (ref 26.0–34.0)
MCHC: 33.4 g/dL (ref 30.0–36.0)
MCV: 91.1 fL (ref 78.0–100.0)
Platelets: 172 10*3/uL (ref 150–400)
RBC: 4.37 MIL/uL (ref 3.87–5.11)
RDW: 11.9 % (ref 11.5–15.5)
WBC: 8.2 10*3/uL (ref 4.0–10.5)

## 2018-09-14 LAB — LIPASE, BLOOD: Lipase: 31 U/L (ref 11–51)

## 2018-09-14 MED ORDER — SODIUM CHLORIDE 0.9 % IV BOLUS
500.0000 mL | Freq: Once | INTRAVENOUS | Status: AC
  Administered 2018-09-14: 500 mL via INTRAVENOUS

## 2018-09-14 MED ORDER — POTASSIUM CHLORIDE CRYS ER 20 MEQ PO TBCR
40.0000 meq | EXTENDED_RELEASE_TABLET | Freq: Once | ORAL | Status: AC
  Administered 2018-09-14: 40 meq via ORAL
  Filled 2018-09-14: qty 2

## 2018-09-14 MED ORDER — MORPHINE SULFATE (PF) 4 MG/ML IV SOLN
4.0000 mg | Freq: Once | INTRAVENOUS | Status: DC
  Filled 2018-09-14: qty 1

## 2018-09-14 MED ORDER — POTASSIUM CHLORIDE CRYS ER 20 MEQ PO TBCR: 20.0000 meq | EXTENDED_RELEASE_TABLET | Freq: Every day | ORAL | 0 refills | Status: DC

## 2018-09-14 MED ORDER — IOHEXOL 300 MG/ML  SOLN
100.0000 mL | Freq: Once | INTRAMUSCULAR | Status: AC | PRN
  Administered 2018-09-14: 100 mL via INTRAVENOUS

## 2018-09-14 MED ORDER — ONDANSETRON HCL 4 MG/2ML IJ SOLN
4.0000 mg | Freq: Once | INTRAMUSCULAR | Status: AC
  Administered 2018-09-14: 4 mg via INTRAVENOUS
  Filled 2018-09-14: qty 2

## 2018-09-14 NOTE — ED Triage Notes (Signed)
Pt states she thinks she has a knot in her umbilicus region. States today she began having pain, shooting into lower abdomen with diarrhea.

## 2018-09-14 NOTE — ED Notes (Signed)
To ct

## 2018-09-14 NOTE — ED Provider Notes (Signed)
[facility] EMERGENCY DEPARTMENT Provider Note   CSN: [id number] Arrival date & time: 09/14/18  1324   History   Chief Complaint Chief Complaint  Patient presents with  . Abdominal Pain  . Diarrhea    HPI Sherri Jones is a 67 y.o. female with history of chronic back pain and prior hysterectomy who presents to the emergency department with complaints of abdominal comfort since 1030 this a.m.  She states that she had some noodles to eat around 10:00, she went to help her brother with something which she bent over to pick something up, she subsequently walked up the stairs and noticed a pain to the epigastrium area.  She states the abdominal pain is located in the epigastrium area radiating to bilateral upper quadrant in the periumbilical location.  States pain is achy and 8 out of 10 in severity with episodic waves of increased pain which are sharp in nature.  This has been constant since onset.  This is without specific alleviating or aggravating factors.  She has had some nausea without vomiting as well as 3 episodes of nonbloody diarrhea.  Feels there is a knot in the epigastrium area, has been present for several months to years.  States she has had similar but less severe abdominal discomfort for the past couple of years which typically occurs after eating, however today's episode which much more severe lasting much longer.  Denies fever, chills, vomiting, melena, hematochezia, dysuria, vaginal bleeding, or vaginal discharge.  Denies chest pain or shortness of breath.  Denies recent antibiotic use or foreign travel.  HPI  Past Medical History:  Diagnosis Date  . Chronic back pain     There are no active problems to display for this patient.   No past surgical history on file.   OB History   None      Home Medications    Prior to Admission medications   Medication Sig Start Date End Date Taking? Authorizing Provider  azithromycin (ZITHROMAX) 250 MG tablet  Take 1 tablet (250 mg total) by mouth daily. Take first 2 tablets together, then 1 every day until finished. 02/24/18   Raeford Razor, MD  diazepam (VALIUM) 5 MG tablet Take 0.5 tablets (2.5 mg total) by mouth every 8 (eight) hours as needed (vertigo). 02/24/18   Raeford Razor, MD  fluticasone (FLONASE) 50 MCG/ACT nasal spray Place 2 sprays into both nostrils daily. 02/24/18   Raeford Razor, MD  meclizine (ANTIVERT) 25 MG tablet Take 1 tablet (25 mg total) by mouth 3 (three) times daily as needed for dizziness. 08/22/17   Tomasita Crumble, MD    Family History No family history on file.  Social History Social History   Tobacco Use  . Smoking status: Never Smoker  . Smokeless tobacco: Never Used  Substance Use Topics  . Alcohol use: No  . Drug use: Not on file     Allergies   Patient has no known allergies.   Review of Systems Review of Systems  Constitutional: Negative for chills and fever.  Respiratory: Negative for shortness of breath.   Cardiovascular: Negative for chest pain.  Gastrointestinal: Positive for abdominal pain, diarrhea and nausea. Negative for anal bleeding, blood in stool, constipation and vomiting.  Genitourinary: Negative for dysuria, vaginal bleeding and vaginal discharge.  All other systems reviewed and are negative.    Physical Exam Updated Vital Signs BP (!) 154/99 (BP Location: Right Arm)   Pulse 83   Temp 98.3 F (36.8 C) (Oral)  Resp 16   Ht 5\' 11"  (1.803 m)   Wt 89.4 kg   SpO2 100%   BMI 27.48 kg/m   Physical Exam  Constitutional: She appears well-developed and well-nourished.  Non-toxic appearance. No distress.  HENT:  Head: Normocephalic and atraumatic.  Eyes: Conjunctivae are normal. Right eye exhibits no discharge. Left eye exhibits no discharge.  Neck: Neck supple.  Cardiovascular: Normal rate and regular rhythm.  Pulmonary/Chest: Effort normal and breath sounds normal. No respiratory distress. She has no wheezes. She has no rhonchi.  She has no rales.  Respiration even and unlabored  Abdominal: Soft. Normal appearance and bowel sounds are normal. She exhibits no distension. There is tenderness in the right upper quadrant, epigastric area, periumbilical area and left upper quadrant. There is no rigidity, no rebound, no guarding, no CVA tenderness, no tenderness at McBurney's point and negative Murphy's sign. No hernia (no obvious palpable defect, exam somewhat limited secondary to body habitus).  Neurological: She is alert.  Clear speech.   Skin: Skin is warm and dry. No rash noted.  Psychiatric: She has a normal mood and affect. Her behavior is normal.  Nursing note and vitals reviewed.   ED Treatments / Results  Labs Results for orders placed or performed during the hospital encounter of 09/14/18  Lipase, blood  Result Value Ref Range   Lipase 31 11 - 51 U/L  Comprehensive metabolic panel  Result Value Ref Range   Sodium 139 135 - 145 mmol/L   Potassium 3.2 (L) 3.5 - 5.1 mmol/L   Chloride 106 98 - 111 mmol/L   CO2 23 22 - 32 mmol/L   Glucose, Bld 106 (H) 70 - 99 mg/dL   BUN 8 8 - 23 mg/dL   Creatinine, Ser 4.09 0.44 - 1.00 mg/dL   Calcium 8.9 8.9 - 81.1 mg/dL   Total Protein 6.5 6.5 - 8.1 g/dL   Albumin 3.7 3.5 - 5.0 g/dL   AST 17 15 - 41 U/L   ALT 17 0 - 44 U/L   Alkaline Phosphatase 103 38 - 126 U/L   Total Bilirubin 0.7 0.3 - 1.2 mg/dL   GFR calc non Af Amer >60 >60 mL/min   GFR calc Af Amer >60 >60 mL/min   Anion gap 10 5 - 15  CBC  Result Value Ref Range   WBC 8.2 4.0 - 10.5 K/uL   RBC 4.37 3.87 - 5.11 MIL/uL   Hemoglobin 13.3 12.0 - 15.0 g/dL   HCT 91.4 78.2 - 95.6 %   MCV 91.1 78.0 - 100.0 fL   MCH 30.4 26.0 - 34.0 pg   MCHC 33.4 30.0 - 36.0 g/dL   RDW 21.3 08.6 - 57.8 %   Platelets 172 150 - 400 K/uL  Urinalysis, Routine w reflex microscopic  Result Value Ref Range   Color, Urine YELLOW YELLOW   APPearance CLEAR CLEAR   Specific Gravity, Urine 1.015 1.005 - 1.030   pH 7.0 5.0 - 8.0    Glucose, UA NEGATIVE NEGATIVE mg/dL   Hgb urine dipstick MODERATE (A) NEGATIVE   Bilirubin Urine NEGATIVE NEGATIVE   Ketones, ur NEGATIVE NEGATIVE mg/dL   Protein, ur NEGATIVE NEGATIVE mg/dL   Nitrite NEGATIVE NEGATIVE   Leukocytes, UA NEGATIVE NEGATIVE   RBC / HPF 0-5 0 - 5 RBC/hpf   WBC, UA 0-5 0 - 5 WBC/hpf   Bacteria, UA NONE SEEN NONE SEEN   Squamous Epithelial / LPF 0-5 0 - 5   Mucus PRESENT  EKG None  Radiology Ct Abdomen Pelvis W Contrast  Result Date: 09/14/2018 CLINICAL DATA:  "Knot" in umbilical region EXAM: CT ABDOMEN AND PELVIS WITH CONTRAST TECHNIQUE: Multidetector CT imaging of the abdomen and pelvis was performed using the standard protocol following bolus administration of intravenous contrast. CONTRAST:  100mL OMNIPAQUE IOHEXOL 300 MG/ML  SOLN COMPARISON:  None. FINDINGS: Lower chest: Unremarkable. Hepatobiliary: No focal abnormality within the liver parenchyma. There is no evidence for gallstones, gallbladder wall thickening, or pericholecystic fluid. No intrahepatic or extrahepatic biliary dilation. Pancreas: No focal mass lesion. No dilatation of the main duct. No intraparenchymal cyst. No peripancreatic edema. Spleen: No splenomegaly. No focal mass lesion. Adrenals/Urinary Tract: No adrenal nodule or mass. Kidneys unremarkable. No evidence for hydroureter. The urinary bladder appears normal for the degree of distention. Stomach/Bowel: Stomach is nondistended. No gastric wall thickening. No evidence of outlet obstruction. Duodenum is normally positioned as is the ligament of Treitz. No small bowel wall thickening. No small bowel dilatation. The terminal ileum is normal. The appendix is not visualized, but there is no edema or inflammation in the region of the cecum. No gross colonic mass. No colonic wall thickening. No substantial diverticular change. Vascular/Lymphatic: There is abdominal aortic atherosclerosis without aneurysm. There is no gastrohepatic or  hepatoduodenal ligament lymphadenopathy. No intraperitoneal or retroperitoneal lymphadenopathy. No pelvic sidewall lymphadenopathy. Reproductive: Uterus surgically absent.  There is no adnexal mass. Other: No intraperitoneal free fluid. Musculoskeletal: No worrisome lytic or sclerotic osseous abnormality.Small right umbilical/paraumbilical hernia contains only fat with hernia sac measuring approximately 14 x 27 x 13 mm. IMPRESSION: 1. Small right umbilical/paraumbilical hernia contains only fat. 2.  Aortic Atherosclerois (ICD10-170.0) Electronically Signed   By: Kennith CenterEric  Mansell M.D.   On: 09/14/2018 19:09    Procedures Procedures (including critical care time)  Medications Ordered in ED Medications  morphine 4 MG/ML injection 4 mg (4 mg Intravenous Not Given 09/14/18 1817)  sodium chloride 0.9 % bolus 500 mL (500 mLs Intravenous New Bag/Given 09/14/18 1812)  ondansetron (ZOFRAN) injection 4 mg (4 mg Intravenous Given 09/14/18 1818)  iohexol (OMNIPAQUE) 300 MG/ML solution 100 mL (100 mLs Intravenous Contrast Given 09/14/18 1836)     Initial Impression / Assessment and Plan / ED Course  I have reviewed the triage vital signs and the nursing notes.  Pertinent labs & imaging results that were available during my care of the patient were reviewed by me and considered in my medical decision making (see chart for details).  Patient presents to the ED with complaints of abdominal pain. Patient nontoxic appearing, in no apparent distress, vitals WNL other than elevated BP, doubt HTN emergency. On exam patient tender to upper abdomen and periumbilical area, no peritoneal signs, no obvious palpable defect. Will evaluate with labs and CT abdomen/pelvis. Analgesics, anti-emetics, and fluids administered.   Labs reviewed and grossly unremarkable. No leukocytosis, no anemia, no significant electrolyte derangements- mild hypokalemia at 3.2- will orally replace. LFTs, renal function, and lipase WNL. Urinalysis  without obvious infection, moderate hgb present- has been present previously, PCP recheck.   Imaging notable for small right umbilical/paraumbilical hernia contains only fat- suspect this is source of patient's sxs.  No evidence of obstruction/strangulation on CT, additionally patient having bowel movements and able to tolerate PO in the emergency department. Do not suspect  appendicitis, cholecystitis, pancreatitis, or diverticulitis.  Patient is feeling much improved and ready to go home. Recommended tylenol/motrin for continued discomfort with general surgery follow up. I discussed results, treatment plan, need for follow-up,  and return precautions with the patient. Provided opportunity for questions, patient confirmed understanding and is in agreement with plan.   Findings and plan of care discussed with supervising physician Dr. Charm Barges who personally evaluated and examined this patient and is in agreement.   Clinical Course as of Sep 14 2049  Sat Sep 14, 2018  [id number] 67 year old female here with new upper abdomen sharp stabbing pain in the setting of a more chronic central subxiphoid pain and lump that she is felt for years that has escaped any diagnosis despite imaging.  She is very nontoxic-appearing with otherwise normal appearing labs other than a potassium 3.2.  She had a CT imaging.  Abdomen is soft nontender without any masses guarding or rebound.   [MB]    Clinical Course User Index [MB] Terrilee Files, MD    Final Clinical Impressions(s) / ED Diagnoses   Final diagnoses:  Umbilical hernia without obstruction and without gangrene    ED Discharge Orders         Ordered    potassium chloride SA (K-DUR,KLOR-CON) 20 MEQ tablet  Daily     09/14/18 [address] PA-C 09/14/18 2122    Terrilee Files, MD 09/15/18 [phone]

## 2018-09-14 NOTE — ED Notes (Signed)
Pt returned from c-t only about 200 ml infused it was stopped by the c-t tech   nss opened to get the liter of fluid in.  Pt alert no distress

## 2018-09-14 NOTE — Discharge Instructions (Signed)
You were seen in the emergency department today for abdominal pain.  Your lab work was all fairly reassuring, your potassium was somewhat low at 3.2, you were given a dose of oral potassium in the ER and we are giving a prescription for a few days worth.  Your urine had a little bit of blood in it, this has been seen previously, would like this rechecked by primary care provider.  Your CT scan showed a umbilical hernia, this is a hernia at the navel.  Please see the attached handout for further information regarding this diagnosis.  Please take Tylenol and or Motrin per over-the-counter dosing instructions for any continued discomfort you may have.  We would like you to follow-up with general surgery within 1 week for reevaluation and further planning of treatment.  Your CT scan also noted some aortic atherosclerosis, this is plaque buildup in the aorta, this is something to be followed by primary care.  Please return to the ER for new or worsening symptoms including but not limited to fever, inability to keep fluids down, worsening pain, constipation, skin change colors over your navel, or any other concerns.

## 2018-10-09 ENCOUNTER — Ambulatory Visit
Admission: RE | Admit: 2018-10-09 | Discharge: 2018-10-09 | Disposition: A | Discharge status: Home / Self Care | Payer: Medicare Other | Source: Ambulatory Visit | Attending: Nurse Practitioner | Admitting: Nurse Practitioner

## 2018-10-09 DIAGNOSIS — Z1231 Encounter for screening mammogram for malignant neoplasm of breast: Secondary | ICD-10-CM

## 2018-10-17 ENCOUNTER — Telehealth: Payer: Self-pay | Admitting: *Deleted

## 2018-10-17 NOTE — Telephone Encounter (Signed)
   [facility] Pre-operative Risk Assessment    Request for surgical clearance:  1. What type of surgery is being performed? UMBILICAL HERNIA REPAIR AND LAPAROSCOPIC CHOLECYSTECTOMY   2. When is this surgery scheduled? TBD   3. Are there any medications that need to be held prior to surgery and how long?   4. Practice name and name of physician performing surgery? [facility]; DR. Redmond Pulling   5. What is your office phone and fax number? PH# [phone], FAX# [phone]   6. Anesthesia type (None, local, MAC, general) ? GENERAL   Sherri Jones 10/17/2018, 11:53 AM  _________________________________________________________________   (provider comments below)

## 2018-10-17 NOTE — Telephone Encounter (Signed)
   Primary Cardiologist:No primary care provider on file.  Chart reviewed as part of pre-operative protocol coverage. Because of Sherri Jones's past medical history and the fact that she has never been seen by cardiology according to records in Epic, she will require an office visit to be established as a new patient in order to better assess preoperative cardiovascular risk.  Pre-op covering staff: - Please schedule appointment-(new patient evaluation)  and call patient to inform them. - Please contact requesting surgeon's office via preferred method (i.e, phone, fax) to inform them of need for appointment prior to surgery.  If applicable, this message will also be routed to pharmacy pool and/or primary cardiologist for input on holding anticoagulant/antiplatelet agent as requested below so that this information is available at time of patient's appointment.   Corine Shelter, PA-C  10/17/2018, 3:36 PM

## 2018-10-18 NOTE — Telephone Encounter (Signed)
Appointment schedule with Dr Antoine Poche 11/12 @ 2:40 pm

## 2018-11-04 NOTE — Progress Notes (Signed)
Cardiology Office Note   Date:  11/05/2018   ID:  Sherri Jones, DOB August 16, 1951, MRN [id number]  PCP:  [place], Sarah T, MD  Cardiologist:   No primary care provider on file. Referring:  Gaynelle Adu, MD  Chief Complaint  Patient presents with  . Pre-op Exam      History of Present Illness: Sherri Jones is a 67 y.o. female who is referred by Gaynelle Adu, MD for evaluation of aortic atherosclerosis.  She is also being seen preoperatively for abdominal hernia repair.    The patient has no past cardiac history.  However, she was noted to have the atherosclerosis when she had a CT recently.  She does report having had a treadmill test many years ago and this was apparently negative.  She does get some shortness of breath if she is noticed over the last 5 to 6 months when she is walking up 2 or 3 flights of stairs.  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  She plays with her grandchildren.  With this kind of activity she does not describe chest discomfort but she will get short of breath which has been slowly progressive.  She can keep going and it might take a little while to recover.  She is not describing resting shortness of breath, PND orthopnea.  She is not having any palpitations, presyncope or syncope.  Past Medical History:  Diagnosis Date  . Chronic back pain   . Hyperlipemia     History reviewed. No pertinent surgical history.   Current Outpatient Medications  Medication Sig Dispense Refill  . albuterol (PROVENTIL HFA;VENTOLIN HFA) 108 (90 Base) MCG/ACT inhaler Inhale into the lungs.    Marland Kitchen aspirin 81 MG tablet Take 81 mg by mouth daily.    . hydrOXYzine (ATARAX/VISTARIL) 50 MG tablet Take 50 mg by mouth daily.    . rosuvastatin (CRESTOR) 10 MG tablet Take 10 mg by mouth daily.  5  . SYMBICORT 80-4.5 MCG/ACT inhaler      No current facility-administered medications for this visit.     Allergies:   Patient has no known allergies.     Social History:  The patient  reports that she has never smoked. She has never used smokeless tobacco. She reports that she does not drink alcohol.   Family History:  The patient's family history includes Cerebral aneurysm (age of onset: 47) in her mother; Heart attack (age of onset: 76) in her father.    ROS:  Please see the history of present illness.   Otherwise, review of systems are positive for none.   All other systems are reviewed and negative.    PHYSICAL EXAM: VS:  BP 122/70   Pulse 76   Ht 5\' 11"  (1.803 m)   Wt 204 lb 9.6 oz (92.8 kg)   BMI 28.54 kg/m  , BMI Body mass index is 28.54 kg/m. GENERAL:  Well appearing HEENT:  Pupils equal round and reactive, fundi not visualized, oral mucosa unremarkable NECK:  No jugular venous distention, waveform within normal limits, carotid upstroke brisk and symmetric, no bruits, no thyromegaly LYMPHATICS:  No cervical, inguinal adenopathy LUNGS:  Clear to auscultation bilaterally BACK:  No CVA tenderness CHEST:  Unremarkable HEART:  PMI not displaced or sustained,S1 and S2 within normal limits, no S3, no S4, no clicks, no rubs, no murmurs ABD:  Flat, positive bowel sounds normal in frequency in pitch, no bruits, no rebound, no guarding, no midline pulsatile  mass, no hepatomegaly, no splenomegaly EXT:  2 plus pulses throughout, no edema, no cyanosis no clubbing SKIN:  No rashes no nodules NEURO:  Cranial nerves II through XII grossly intact, motor grossly intact throughout PSYCH:  Cognitively intact, oriented to person place and time    EKG:  EKG is ordered today. The ekg ordered today demonstrates sinus rhythm, rate 76, axis within normal limits, intervals within normal limits, no acute ST-T wave changes.   Recent Labs: 09/14/2018: ALT 17; BUN 8; Creatinine, Ser 0.86; Hemoglobin 13.3; Platelets 172; Potassium 3.2; Sodium 139    Lipid Panel No results found for: CHOL, TRIG, HDL, CHOLHDL, VLDL, LDLCALC, LDLDIRECT    Wt  Readings from Last 3 Encounters:  11/05/18 204 lb 9.6 oz (92.8 kg)  09/14/18 197 lb (89.4 kg)  02/24/18 197 lb (89.4 kg)      Other studies Reviewed: Additional studies/ records that were reviewed today include: CT. Review of the above records demonstrates:  Please see elsewhere in the note.     ASSESSMENT AND PLAN:   AORTIC ATHEROSCLEROSIS:   The patient does have aortic atherosclerosis is noted.  Her risk factors are being managed which is primarily her dyslipidemia.  Further evaluation will be as below.  HYPERLIPIDEMIA: Her LDL in March was 142 with an HDL of 62.  With diet predominantly gets down to 83 with HDL 74 and she is now on a statin as well.  I will defer to her primary physician.  PREOP:  She is to have abdominal hernia repair.  Given her shortness of breath she will have a POET (Plain Old Exercise Treadmill).  Preoperative clearance will be based on this result.  Current medicines are reviewed at length with the patient today.  The patient does not have concerns regarding medicines.  The following changes have been made:  no change  Labs/ tests ordered today include:   Orders Placed This Encounter  Procedures  . Pro b natriuretic peptide (BNP)[facility])  . EXERCISE TOLERANCE TEST (ETT)  . EKG 12-Lead     Disposition:   FU with me as needed.      Signed, Rollene Rotunda, MD  11/05/2018 3:36 PM    [facility]

## 2018-11-05 ENCOUNTER — Ambulatory Visit (INDEPENDENT_AMBULATORY_CARE_PROVIDER_SITE_OTHER): Payer: Medicare Other | Admitting: Cardiology

## 2018-11-05 ENCOUNTER — Encounter: Payer: Self-pay | Admitting: Cardiology

## 2018-11-05 VITALS — BP 122/70 | HR 76 | Ht 71.0 in | Wt 204.6 lb

## 2018-11-05 DIAGNOSIS — Z01810 Encounter for preprocedural cardiovascular examination: Secondary | ICD-10-CM | POA: Diagnosis not present

## 2018-11-05 DIAGNOSIS — I700 Atherosclerosis of aorta: Secondary | ICD-10-CM

## 2018-11-05 DIAGNOSIS — R0602 Shortness of breath: Secondary | ICD-10-CM | POA: Diagnosis not present

## 2018-11-05 DIAGNOSIS — E785 Hyperlipidemia, unspecified: Secondary | ICD-10-CM | POA: Diagnosis not present

## 2018-11-05 NOTE — Patient Instructions (Signed)
Medication Instructions:  Continue current medications  If you need a refill on your cardiac medications before your next appointment, please call your pharmacy.  Labwork: BNP Today HERE IN OUR OFFICE AT [facility]  Take the provided lab slips with you to the lab for your blood draw.   You will NOT need to fast   If you have labs (blood work) drawn today and your tests are completely normal, you will receive your results only by: . MyChart Message (if you have MyChart) OR . A paper copy in the mail If you have any lab test that is abnormal or we need to change your treatment, we will call you to review the results.  Testing/Procedures: Your physician has requested that you have an exercise tolerance test. For further information please visit [email or web address]. Please also follow instruction sheet, as given.  Follow-Up: . You will need a follow up appointment in As Needed.   At [facility], you and your health needs are our priority.  As part of our continuing mission to provide you with exceptional heart care, we have created designated Provider Care Teams.  These Care Teams include your primary Cardiologist (physician) and Advanced Practice Providers (APPs -  Physician Assistants and Nurse Practitioners) who all work together to provide you with the care you need, when you need it.   Thank you for choosing [facility] at [facility]!!       

## 2018-11-06 LAB — PRO B NATRIURETIC PEPTIDE: NT-Pro BNP: 52 pg/mL (ref 0–301)

## 2018-11-12 ENCOUNTER — Telehealth (HOSPITAL_COMMUNITY): Payer: Self-pay

## 2018-11-12 NOTE — Telephone Encounter (Signed)
Encounter complete. 

## 2018-11-14 ENCOUNTER — Ambulatory Visit (HOSPITAL_COMMUNITY)
Admission: RE | Admit: 2018-11-14 | Discharge: 2018-11-14 | Disposition: A | Discharge status: Home / Self Care | Payer: Medicare Other | Source: Ambulatory Visit | Attending: Cardiovascular Disease | Admitting: Cardiovascular Disease

## 2018-11-14 DIAGNOSIS — R0602 Shortness of breath: Secondary | ICD-10-CM | POA: Diagnosis present

## 2018-11-14 LAB — EXERCISE TOLERANCE TEST
CHL CUP RESTING HR STRESS: 95 {beats}/min
CSEPEDS: 34 s
CSEPEW: 7.8 METS
CSEPHR: 101 %
Exercise duration (min): 6 min
MPHR: 155 {beats}/min
Peak HR: 157 {beats}/min
RPE: 19

## 2018-12-06 NOTE — Telephone Encounter (Signed)
Follow up    Sherri Jones with [facility] is calling to get an update of cardiac clearance. Please call to discuss. Updated fax number is [phone].

## 2018-12-09 ENCOUNTER — Telehealth: Payer: Self-pay | Admitting: Cardiology

## 2018-12-09 NOTE — Telephone Encounter (Signed)
     [facility] Pre-operative Risk Assessment    Request for surgical clearance:  1. What type of surgery is being performed? Hernia surgery  2. When is this surgery scheduled? TBD  3. What type of clearance is required (medical clearance vs. Pharmacy clearance to hold med vs. Both)? medical  4. Are there any medications that need to be held prior to surgery and how long?  no  5. Practice name and name of physician performing surgery? [facility], Dr. Redmond Pulling  6. What is your office phone number [phone]   7.   What is your office fax number [phone]  8.   Anesthesia type (None, local, MAC, general) ? general   Sherri Jones 12/09/2018, 9:19 AM  _________________________________________________________________   (provider comments below)

## 2018-12-10 NOTE — Telephone Encounter (Signed)
   Primary Cardiologist: Rollene RotundaJames Hochrein, MD  Chart reviewed as part of pre-operative protocol coverage. Given past medical history and time since last visit, based on ACC/AHA guidelines, Jonell CluckGwenda N Pavone would be at acceptable risk for the planned procedure without further cardiovascular testing.  She has been seen and evaluated by Dr. Daiva NakayamaJ. Hochrein and ETT was done which was negative for ischemia so Dr. Antoine PocheHochrein feels she is at acceptable risk.   I will route this recommendation to the requesting party via Epic fax function and remove from pre-op pool.  Please call with questions.  Nada BoozerLaura Robertlee Rogacki, NP 12/10/2018, 2:38 PM

## 2018-12-10 NOTE — Telephone Encounter (Signed)
I have notified requesting office.  

## 2018-12-10 NOTE — Telephone Encounter (Signed)
Please let [facility] know I have sent clearance, were waiting for stress test.

## 2021-08-26 ENCOUNTER — Emergency Department (HOSPITAL_COMMUNITY): Payer: Medicare Other

## 2021-08-26 ENCOUNTER — Emergency Department (HOSPITAL_COMMUNITY)
Admission: EM | Admit: 2021-08-26 | Discharge: 2021-08-26 | Disposition: A | Discharge status: Home / Self Care | Payer: Medicare Other | Attending: Emergency Medicine | Admitting: Emergency Medicine

## 2021-08-26 ENCOUNTER — Other Ambulatory Visit: Payer: Self-pay

## 2021-08-26 DIAGNOSIS — I10 Essential (primary) hypertension: Secondary | ICD-10-CM | POA: Diagnosis not present

## 2021-08-26 DIAGNOSIS — R42 Dizziness and giddiness: Secondary | ICD-10-CM | POA: Diagnosis not present

## 2021-08-26 DIAGNOSIS — J45909 Unspecified asthma, uncomplicated: Secondary | ICD-10-CM | POA: Insufficient documentation

## 2021-08-26 DIAGNOSIS — Z79899 Other long term (current) drug therapy: Secondary | ICD-10-CM | POA: Insufficient documentation

## 2021-08-26 DIAGNOSIS — R519 Headache, unspecified: Secondary | ICD-10-CM | POA: Diagnosis not present

## 2021-08-26 DIAGNOSIS — Z7982 Long term (current) use of aspirin: Secondary | ICD-10-CM | POA: Diagnosis not present

## 2021-08-26 DIAGNOSIS — H9202 Otalgia, left ear: Secondary | ICD-10-CM | POA: Diagnosis not present

## 2021-08-26 LAB — URINALYSIS, ROUTINE W REFLEX MICROSCOPIC
Bacteria, UA: NONE SEEN
Bilirubin Urine: NEGATIVE
Glucose, UA: NEGATIVE mg/dL
Ketones, ur: NEGATIVE mg/dL
Leukocytes,Ua: NEGATIVE
Nitrite: NEGATIVE
Protein, ur: NEGATIVE mg/dL
Specific Gravity, Urine: 1.006 (ref 1.005–1.030)
pH: 7 (ref 5.0–8.0)

## 2021-08-26 LAB — CBC
HCT: 37.2 % (ref 36.0–46.0)
Hemoglobin: 12.7 g/dL (ref 12.0–15.0)
MCH: 30.5 pg (ref 26.0–34.0)
MCHC: 34.1 g/dL (ref 30.0–36.0)
MCV: 89.2 fL (ref 80.0–100.0)
Platelets: 249 10*3/uL (ref 150–400)
RBC: 4.17 MIL/uL (ref 3.87–5.11)
RDW: 12 % (ref 11.5–15.5)
WBC: 8.2 10*3/uL (ref 4.0–10.5)
nRBC: 0 % (ref 0.0–0.2)

## 2021-08-26 LAB — COMPREHENSIVE METABOLIC PANEL
ALT: 17 U/L (ref 0–44)
AST: 20 U/L (ref 15–41)
Albumin: 3.7 g/dL (ref 3.5–5.0)
Alkaline Phosphatase: 99 U/L (ref 38–126)
Anion gap: 11 (ref 5–15)
BUN: 15 mg/dL (ref 8–23)
CO2: 23 mmol/L (ref 22–32)
Calcium: 9 mg/dL (ref 8.9–10.3)
Chloride: 102 mmol/L (ref 98–111)
Creatinine, Ser: 0.84 mg/dL (ref 0.44–1.00)
GFR, Estimated: >60 mL/min (ref >60)
Glucose, Bld: 130 mg/dL — ABNORMAL HIGH (ref 70–99)
Potassium: 3 mmol/L — ABNORMAL LOW (ref 3.5–5.1)
Sodium: 136 mmol/L (ref 135–145)
Total Bilirubin: 0.6 mg/dL (ref 0.3–1.2)
Total Protein: 6.7 g/dL (ref 6.5–8.1)

## 2021-08-26 LAB — LIPASE, BLOOD: Lipase: 28 U/L (ref 11–51)

## 2021-08-26 MED ORDER — ONDANSETRON 4 MG PO TBDP
4.0000 mg | ORAL_TABLET | Freq: Once | ORAL | Status: AC
  Administered 2021-08-26: 4 mg via ORAL
  Filled 2021-08-26: qty 1

## 2021-08-26 MED ORDER — ONDANSETRON 4 MG PO TBDP: 4.0000 mg | ORAL_TABLET | Freq: Three times a day (TID) | ORAL | 0 refills | Status: DC | PRN

## 2021-08-26 MED ORDER — MECLIZINE HCL 25 MG PO TABS
12.5000 mg | ORAL_TABLET | Freq: Once | ORAL | Status: AC
  Administered 2021-08-26: 12.5 mg via ORAL
  Filled 2021-08-26: qty 1

## 2021-08-26 MED ORDER — MECLIZINE HCL 12.5 MG PO TABS: 12.5000 mg | ORAL_TABLET | Freq: Three times a day (TID) | ORAL | 0 refills | Status: DC | PRN

## 2021-08-26 NOTE — ED Provider Notes (Signed)
[facility] EMERGENCY DEPARTMENT Provider Note   CSN: [id number] Arrival date & time: 08/26/21  0055     History Chief Complaint  Patient presents with   Headache   Dizziness    Sherri Jones is a 70 y.o. female with a history of HTN, dyslipidemia, aortic atherosclerosis, prediabetes, asthma who presents the emergency department by EMS with a chief complaint of dizziness.  The patient reports that on 8/31 she was shopping at the grocery store with her granddaughter when she suddenly felt lightheaded and dizzy, which she describes as room spinning, accompanied by a headache to the left frontal region, and gait instability.  Symptoms lasted for several hours until she got home and was able to lay down and take a nap.  When she awoke, symptoms had resolved.  She reports that symptoms returned this afternoon and have been constant since onset.  She reports that sharp stabbing headache that is nonradiating to the left frontal region.  He does also report that she has had some pain in her left ear as well as some soreness to her left face.  Dizziness also returned and she required assistance from her grandchildren to help her ambulate around the home.  Symptoms have been constant and gradually worsening since onset.  She did not have any falls or injuries.  Tonight, she had 3 episodes of nonbloody, nonbilious vomiting and began feeling generally weak all over, which prompted her to call EMS.  No neck pain or stiffness, fever or chills, tinnitus, visual changes, nasal congestion, sore throat, chest pain, shortness of breath, abdominal pain, diarrhea, dysuria.   She does have a history of vertigo several years ago that did present similarly.  She also reports that she was seen by her PCP and had her home losartan increased from 50 mg to 100 mg, which she started taking just over a week ago.  The history is provided by the patient and medical records. No language interpreter was used.       Past Medical History:  Diagnosis Date   Chronic back pain    Hyperlipemia     Patient Active Problem List   Diagnosis Date Noted   Aortic atherosclerosis (HCC) 11/05/2018   Preop cardiovascular exam 11/05/2018   SOB (shortness of breath) 11/05/2018   Dyslipidemia 11/05/2018    No past surgical history on file.   OB History   No obstetric history on file.     Family History  Problem Relation Age of Onset   Cerebral aneurysm Mother 30   Heart attack Father 91       Died age 47    Social History   Tobacco Use   Smoking status: Never   Smokeless tobacco: Never  Substance Use Topics   Alcohol use: No    Home Medications Prior to Admission medications   Medication Sig Start Date End Date Taking? Authorizing Provider  albuterol (PROVENTIL HFA;VENTOLIN HFA) 108 (90 Base) MCG/ACT inhaler Inhale 1-2 puffs into the lungs every 6 (six) hours as needed for wheezing or shortness of breath. 03/01/18  Yes [provider]  aspirin 81 MG tablet Take 81 mg by mouth daily.   Yes [provider]  diclofenac Sodium (VOLTAREN) 1 % GEL Apply 2 g topically 4 (four) times daily as needed (pain). 08/21/21  Yes [provider]  hydrOXYzine (ATARAX/VISTARIL) 50 MG tablet Take 50 mg by mouth daily. 10/25/18  Yes [provider]  Ibuprofen-Famotidine 800-26.6 MG TABS Take 1 tablet  by mouth as needed (pain). 09/17/20  Yes [provider]  losartan (COZAAR) 100 MG tablet Take 100 mg by mouth daily. 08/17/21  Yes [provider]  meclizine (ANTIVERT) 12.5 MG tablet Take 1 tablet (12.5 mg total) by mouth 3 (three) times daily as needed for dizziness. 08/26/21  Yes Oanh Devivo A, PA-C  ondansetron (ZOFRAN ODT) 4 MG disintegrating tablet Take 1 tablet (4 mg total) by mouth every 8 (eight) hours as needed. 08/26/21  Yes Maejor Erven A, PA-C  rosuvastatin (CRESTOR) 10 MG tablet Take 10 mg by mouth daily. 09/24/18  Yes [provider]   SYMBICORT 80-4.5 MCG/ACT inhaler Inhale 2 puffs into the lungs in the morning and at bedtime. 10/25/18  Yes [provider]    Allergies    Patient has no known allergies.  Review of Systems   Review of Systems  Constitutional:  Negative for chills, diaphoresis, fatigue and fever.  HENT:  Positive for ear pain. Negative for congestion, ear discharge, hearing loss, mouth sores, sinus pressure, sinus pain, sore throat, tinnitus and voice change.   Eyes:  Negative for visual disturbance.  Respiratory:  Negative for cough, shortness of breath and wheezing.   Cardiovascular:  Negative for chest pain and palpitations.  Gastrointestinal:  Positive for nausea and vomiting. Negative for abdominal pain, constipation and diarrhea.  Genitourinary:  Negative for dysuria, frequency and urgency.  Musculoskeletal:  Negative for back pain, gait problem, myalgias, neck pain and neck stiffness.  Skin:  Negative for color change, rash and wound.  Allergic/Immunologic: Negative for immunocompromised state.  Neurological:  Positive for dizziness, weakness and headaches. Negative for seizures, syncope and numbness.  Psychiatric/Behavioral:  Negative for confusion.    Physical Exam Updated Vital Signs BP 139/85   Pulse 82   Temp 97.8 F (36.6 C) (Oral)   Resp 16   Ht 5\' 11"  (1.803 m)   Wt 90.7 kg   SpO2 98%   BMI 27.89 kg/m   Physical Exam Vitals and nursing note reviewed.  Constitutional:      General: She is not in acute distress.    Appearance: She is not ill-appearing, toxic-appearing or diaphoretic.  HENT:     Head: Normocephalic.     Right Ear: Tympanic membrane, ear canal and external ear normal.     Left Ear: Tympanic membrane, ear canal and external ear normal.     Mouth/Throat:     Mouth: Mucous membranes are moist.     Pharynx: No oropharyngeal exudate or posterior oropharyngeal erythema.  Eyes:     General:        Right eye: No discharge.        Left eye: No discharge.      Conjunctiva/sclera: Conjunctivae normal.     Comments: No nystagmus.  Pupils are equal round reactive to light.  Extraocular movements are intact.  Cardiovascular:     Rate and Rhythm: Normal rate and regular rhythm.     Heart sounds: No murmur heard.   No friction rub. No gallop.  Pulmonary:     Effort: Pulmonary effort is normal. No respiratory distress.     Breath sounds: No stridor. No wheezing, rhonchi or rales.  Chest:     Chest wall: No tenderness.  Abdominal:     General: There is no distension.     Palpations: Abdomen is soft. There is no mass.     Tenderness: There is no abdominal tenderness. There is no right CVA tenderness, left CVA tenderness,  guarding or rebound.     Hernia: No hernia is present.     Comments: Abdomen is soft, nontender, nondistended.  Musculoskeletal:     Cervical back: Neck supple.     Right lower leg: No edema.     Left lower leg: No edema.  Skin:    General: Skin is warm.     Capillary Refill: Capillary refill takes less than 2 seconds.     Findings: No rash.  Neurological:     Mental Status: She is alert and oriented to person, place, and time.     Comments: Mental Status: Patient is awake, alert, oriented to person, place, month, year, and situation. Patient is able to give a clear and coherent history. Cranial Nerves: II: Visual Fields are full. Pupils are equal, round, and reactive to light. III,IV, VI: EOMI without ptosis or diploplia.  V: Facial sensation is symmetric to temperature VII: Facial movement is symmetric.  VIII: hearing is intact to voice X: Uvula elevates symmetrically XI: Shoulder shrug is symmetric. XII: tongue is midline without atrophy or fasciculations.  Motor: Tone is normal. Bulk is normal. 5/5 strength was present in all four extremities.  Sensory: Sensation is symmetric to light touch and temperature in the arms and legs. Cerebellar: FNF intact bilaterally Unsteady gait.    Psychiatric:         Behavior: Behavior normal.    ED Results / Procedures / Treatments   Labs (all labs ordered are listed, but only abnormal results are displayed) Labs Reviewed  COMPREHENSIVE METABOLIC PANEL - Abnormal; Notable for the following components:      Result Value   Potassium 3.0 (*)    Glucose, Bld 130 (*)    All other components within normal limits  URINALYSIS, ROUTINE W REFLEX MICROSCOPIC - Abnormal; Notable for the following components:   Color, Urine COLORLESS (*)    Hgb urine dipstick MODERATE (*)    All other components within normal limits  LIPASE, BLOOD  CBC    EKG None  Radiology CT HEAD WO CONTRAST ( )  Result Date: 08/26/2021 CLINICAL DATA:  Headache and dizziness.  Nausea and vomiting. EXAM: CT HEAD WITHOUT CONTRAST TECHNIQUE: Contiguous axial images were obtained from the base of the skull through the vertex without intravenous contrast. COMPARISON:  08/28/2017 FINDINGS: Brain: There is no evidence for acute hemorrhage, hydrocephalus, mass lesion, or abnormal extra-axial fluid collection. No definite CT evidence for acute infarction. Vascular: No hyperdense vessel or unexpected calcification. Skull: No evidence for fracture. No worrisome lytic or sclerotic lesion. Sinuses/Orbits: Chronic mucosal disease is seen in the right maxillary sinus with scattered opacification of ethmoid air cells and prominent mucosal disease in the frontal sinuses. Mastoid air cells are clear bilaterally. Visualized portions of the globes and intraorbital fat are unremarkable. Other: None. IMPRESSION: 1. No acute intracranial abnormality. 2. Chronic paranasal sinus disease. Electronically Signed   By: Kennith Center M.D.   On: 08/26/2021 05:48    Procedures Procedures   Medications Ordered in ED Medications  meclizine (ANTIVERT) tablet 12.5 mg (12.5 mg Oral Given 08/26/21 0502)  ondansetron (ZOFRAN-ODT) disintegrating tablet 4 mg (4 mg Oral Given 08/26/21 0503)    ED Course  I have reviewed the  triage vital signs and the nursing notes.  Pertinent labs & imaging results that were available during my care of the patient were reviewed by me and considered in my medical decision making (see chart for details).    MDM Rules/Calculators/A&P  70 year old female with a history of HTN, dyslipidemia, aortic atherosclerosis, prediabetes, asthma who presents the emergency department with dizziness, headache, nausea, vomiting, and gait instability, onset this afternoon.  She was initially reporting some ear pain over the last few weeks, but this has not worsened.  She did have a similar episode of this constellation of symptoms yesterday, but symptoms resolved after a nap.  Vital signs are stable.  On exam, she does have some ataxia.  No other focal neurologic findings.  Discussed the patient with Dr. Blinda LeatherwoodPollina, who is in agreement with work-up and plan.  There is concern that maybe patient's symptoms yesterday were TIA and today she could be having a posterior stroke.  However, her chart indicates that she has had 2 previous ED visits for vertigo that resolved with meclizine.  Did also consider medication side effect from recently increased losartan, but this is less likely after reviewing medication side effects is dizziness is only seen in about 2 to 3% of patients.  Labs and imaging of been reviewed and independently interpreted by me. No significant metabolic derangements.  CBC is unremarkable.  UA is not concerning for infection.  CT head with chronic paranasal sinus disease.  No acute abnormalities.  HEENT exam is unremarkable.  Patient was given meclizine and Zofran, and on reevaluation symptoms had resolved.  She was ambulated again by me and a ataxia had resolved.  This appears consistent with previous ED visits for vertigo.  Suspect peripheral vertigo.  Will discharge home with symptomatic management.  She can follow-up with primary care.  ER return precautions given.   Safer discharge to home with outpatient follow-up as discussed.  Final Clinical Impression(s) / ED Diagnoses Final diagnoses:  Vertigo    Rx / DC Orders ED Discharge Orders          Ordered    meclizine (ANTIVERT) 12.5 MG tablet  3 times daily PRN        08/26/21 0610    ondansetron (ZOFRAN ODT) 4 MG disintegrating tablet  Every 8 hours PRN        08/26/21 0610             Frederik PearMcDonald, Ellyson Rarick A, PA-C 08/26/21 0741    Gilda CreasePollina, Christopher J, MD 08/27/21 [phone]

## 2021-08-26 NOTE — ED Notes (Signed)
Pt ambulated to the restroom with the use of a walker

## 2021-08-26 NOTE — Discharge Instructions (Addendum)
Thank you for allowing me to care for you today in the Emergency Department.   You can take 1 dose of meclizine every 8 hours as needed for dizziness.  Let 1 tablet of Zofran dissolve in your tongue every 8 hours as needed for nausea or vomiting.  You can follow-up with primary care for recheck of your symptoms in 1 week.  If you having difficulty walking, vomiting, dizziness that does not improve with the medication by visit dated today, if you have new numbness or weakness, facial droop, slurred speech, or other new, concerning symptoms, return to the emergency department for further evaluation.

## 2021-08-26 NOTE — ED Notes (Signed)
Patient transported to CT 

## 2021-08-26 NOTE — ED Triage Notes (Signed)
Pt brought in by EMS for a headache and dizziness. Pt also reports nausea and vomiting.

## 2022-04-28 ENCOUNTER — Encounter: Payer: Self-pay | Admitting: Pulmonary Disease

## 2022-04-28 ENCOUNTER — Ambulatory Visit (INDEPENDENT_AMBULATORY_CARE_PROVIDER_SITE_OTHER): Payer: Medicare Other | Admitting: Pulmonary Disease

## 2022-04-28 VITALS — BP 128/72 | HR 95 | Temp 98.2°F | Ht 71.0 in | Wt 211.0 lb

## 2022-04-28 DIAGNOSIS — J4530 Mild persistent asthma, uncomplicated: Secondary | ICD-10-CM

## 2022-04-28 DIAGNOSIS — R918 Other nonspecific abnormal finding of lung field: Secondary | ICD-10-CM

## 2022-04-28 LAB — CBC WITH DIFFERENTIAL/PLATELET
Basophils Absolute: 0.1 10*3/uL (ref 0.0–0.1)
Basophils Relative: 1.1 % (ref 0.0–3.0)
Eosinophils Absolute: 0.7 10*3/uL (ref 0.0–0.7)
Eosinophils Relative: 5.3 % — ABNORMAL HIGH (ref 0.0–5.0)
HCT: 38.4 % (ref 36.0–46.0)
Hemoglobin: 12.7 g/dL (ref 12.0–15.0)
Lymphocytes Relative: 22 % (ref 12.0–46.0)
Lymphs Abs: 2.9 10*3/uL (ref 0.7–4.0)
MCHC: 33.1 g/dL (ref 30.0–36.0)
MCV: 90.3 fl (ref 78.0–100.0)
Monocytes Absolute: 1 10*3/uL (ref 0.1–1.0)
Monocytes Relative: 7.1 % (ref 3.0–12.0)
Neutro Abs: 8.6 10*3/uL — ABNORMAL HIGH (ref 1.4–7.7)
Neutrophils Relative %: 64.5 % (ref 43.0–77.0)
Platelets: 266 10*3/uL (ref 150.0–400.0)
RBC: 4.26 Mil/uL (ref 3.87–5.11)
RDW: 13.5 % (ref 11.5–15.5)
WBC: 13.3 10*3/uL — ABNORMAL HIGH (ref 4.0–10.5)

## 2022-04-28 MED ORDER — ALBUTEROL SULFATE HFA 108 (90 BASE) MCG/ACT IN AERS: 1.0000 | INHALATION_SPRAY | Freq: Four times a day (QID) | RESPIRATORY_TRACT | 3 refills | Status: DC | PRN

## 2022-04-28 MED ORDER — FLUTICASONE-SALMETEROL 250-50 MCG/ACT IN AEPB: 1.0000 | INHALATION_SPRAY | Freq: Two times a day (BID) | RESPIRATORY_TRACT | 5 refills | Status: DC

## 2022-04-28 NOTE — Patient Instructions (Addendum)
Mild persistent asthma ?STOP Trelegy ?START Advair 250-50 mcg ONE puff TWICE a day ?START Albuterol HFA TWO puffs AS NEEDED ?CONTINUE nebulizer AS NEEDED ?CONTINUE Singulair 10 mg daily ?ORDER pulmonary function tests ?ORDER labs: CBC with diff and IgE ? ?Multiple subcentimeter pulmonary nodules ?Borderline mediastinal lymphadenopathy ?Based on available report, suspect infectious/inflammatory etiology. Low suspicion for neoplastic process. Agree with surveillance imaging ?-- Scheduled for repeat CT in July 2023.  If worsening, please contact our clinic to discuss management options ? ?Follow-up with me in July with PFTs before visit ?

## 2022-04-28 NOTE — Progress Notes (Signed)
? ? ?Subjective:  ? ?PATIENT ID: Sherri Jones GENDER: female DOB: 1951-03-07, MRN: [id number] ? ? ?HPI ? ?Chief Complaint  ?Patient presents with  ? Consult  ?  Pt is here for asthma. She states she has had asthma since she was 87 but she just started having issues in September. Albuterol as needed and albuterol neb solutions and singular for allergies.   ? ? ?Reason for Visit: New consult for asthma ? ?Ms. Dion Parrow is a 71 year old female never smoker with asthma, multiple subcentimeter lung nodules, mild mediastinal adenopathy, hypertension, hyperlipidemia, diabetes and aortic atherosclerosis who presents for evaluation for asthma and wheezing, mediastinal lymphadenopathy pulmonary nodules. She presents with her sister. ? ?She is followed by her PCP at [facility], [place].  Note from 03/29/2022 reviewed.  Has had persistent asthma symptoms despite using Trelegy.  CT scan reviewed as noted below. Referred to pulmonary for further management ? ?Starting in September she had bronchitis and has had wheezing and chest congestion with sputum production. Shortness of breath with exertion. Symptoms worsen at night. Has nasal congestion. COVID and influenza neg. She has  No prior bronchitis but reports asthma in her 6s and used an albuterol inhaler and very used it except when exposed to smoke or irritants. Currently uses albuterol nebulizer once a day. Does not have handheld albuterol. Her symptoms have limited her activity including reducing distances.  She has tried Trelegy for 3 months but does not feel like it lasts. ? 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 ?I have personally reviewed patient's past medical/family/social history, allergies, current medications. ? ?Past Medical History:  ?Diagnosis Date  ? Chronic back pain   ? Hyperlipemia   ?  ? ?Family History  ?Problem Relation Age of Onset  ? Cerebral aneurysm Mother 80   ? Heart attack Father 45  ?     Died age 19  ?  ? ?Social History  ? ?Occupational History  ? Not on file  ?Tobacco Use  ? Smoking status: Never  ? Smokeless tobacco: Never  ?Substance and Sexual Activity  ? Alcohol use: No  ? Drug use: Not on file  ? Sexual activity: Not on file  ? ? ?No Known Allergies  ? ?Outpatient Medications Prior to Visit  ?Medication Sig Dispense Refill  ? aspirin 81 MG tablet Take 81 mg by mouth daily.    ? diclofenac Sodium (VOLTAREN) 1 % GEL Apply 2 g topically 4 (four) times daily as needed (pain).    ? gabapentin (NEURONTIN) 300 MG capsule Take 300 mg by mouth 3 (three) times daily.    ? hydrOXYzine (ATARAX/VISTARIL) 50 MG tablet Take 50 mg by mouth daily.    ? Ibuprofen-Famotidine 800-26.6 MG TABS Take 1 tablet by mouth as needed (pain).    ? losartan (COZAAR) 100 MG tablet Take 100 mg by mouth daily.    ? meclizine (ANTIVERT) 12.5 MG tablet Take 1 tablet (12.5 mg total) by mouth 3 (three) times daily as needed for dizziness. 30 tablet 0  ? montelukast (SINGULAIR) 10 MG tablet Take 10 mg by mouth daily.    ? ondansetron (ZOFRAN ODT) 4 MG disintegrating tablet Take 1 tablet (4 mg total) by mouth every 8 (eight) hours as needed. 20 tablet 0  ? rosuvastatin (CRESTOR) 10 MG tablet Take 10 mg by mouth daily.  5  ? albuterol (PROVENTIL HFA;VENTOLIN HFA) 108 (90 Base)  MCG/ACT inhaler Inhale 1-2 puffs into the lungs every 6 (six) hours as needed for wheezing or shortness of breath.    ? Fluticasone-Umeclidin-Vilant (TRELEGY ELLIPTA) 100-62.5-25 MCG/ACT AEPB Inhale 1 puff into the lungs daily.    ? SYMBICORT 80-4.5 MCG/ACT inhaler Inhale 2 puffs into the lungs in the morning and at bedtime.    ? WIXELA INHUB 500-50 MCG/ACT AEPB Inhale 1 puff into the lungs 2 (two) times daily.    ? ?No facility-administered medications prior to visit.  ? ? ?Review of Systems  ?Constitutional:  Negative for chills, diaphoresis, fever, malaise/fatigue and weight loss.  ?HENT:  Positive for congestion.    ?Respiratory:  Positive for cough, sputum production and wheezing. Negative for hemoptysis and shortness of breath.   ?Cardiovascular:  Negative for chest pain, palpitations and leg swelling.  ? ? ?Objective:  ? ?Vitals:  ? 04/28/22 1104  ?BP: 128/72  ?Pulse: 95  ?Temp: 98.2 ?F (36.8 ?C)  ?TempSrc: Oral  ?SpO2: 98%  ?Weight: 211 lb (95.7 kg)  ?Height: 5\' 11"  (1.803 m)  ? ?SpO2: 98 % ?O2 Device: None (Room air) ? ?Physical Exam: ?General: Well-appearing, no acute distress ?HENT: [place], AT ?Eyes: EOMI, no scleral icterus ?Respiratory: Clear to auscultation bilaterally.  No crackles, wheezing or rales ?Cardiovascular: RRR, -M/R/G, no JVD ?Extremities:-Edema,-tenderness ?Neuro: AAO x4, CNII-XII grossly intact ?Psych: Normal mood, normal affect ? ?Data Reviewed: ? ?Imaging: ?CT chest 03/28/2022-(report only).  Impression: 1 mild central bronchial wall thickening with scattered areas of linear atelectasis. ?2.  Small mediastinal lymph nodes, borderline enlarged.  Consider chest CT in 3 to 6 months to document stability. ?3.  Multiple calcified noncalcified pulmonary nodules bilaterally.  Largest noncalcified is 5 mm.  Follow-up based on Fleischner criteria ? ?PFT: ?None on file ? ?Labs: ?CBC ?   ?Component Value Date/Time  ? WBC 8.2 08/26/2021 0119  ? RBC 4.17 08/26/2021 0119  ? HGB 12.7 08/26/2021 0119  ? HCT 37.2 08/26/2021 0119  ? PLT 249 08/26/2021 0119  ? MCV 89.2 08/26/2021 0119  ? MCH 30.5 08/26/2021 0119  ? MCHC 34.1 08/26/2021 0119  ? RDW 12.0 08/26/2021 0119  ? ?   ?Assessment & Plan:  ? ?Discussion: ?71 year old female with asthma, multiple subcentimeter lung nodules, mild mediastinal adenopathy, hypertension, hyperlipidemia and aortic atherosclerosis who presents for evaluation for asthma and wheezing, mediastinal lymphadenopathy pulmonary nodules. ? ?Mild persistent asthma ?STOP Trelegy ?START Advair 250-50 mcg ONE puff TWICE a day ?START Albuterol HFA TWO puffs AS NEEDED ?CONTINUE nebulizer AS NEEDED ?CONTINUE  Singulair 10 mg daily ?ORDER pulmonary function tests ?ORDER labs: CBC with diff and IgE ? ?Multiple subcentimeter pulmonary nodules ?Borderline mediastinal lymphadenopathy ?Based on available report, suspect infectious/inflammatory etiology. Low suspicion for neoplastic process. Agree with surveillance imaging ?-- Scheduled for repeat CT in July 2023.  If worsening, please contact our clinic to discuss management options ? ?Health Maintenance ?Immunization History  ?Administered Date(s) Administered  ? Fluad Quad(high Dose 65+) 10/25/2018, 09/09/2020, 09/28/2021  ? Influenza Split 10/30/2011, 11/19/2012  ? Influenza, Seasonal, Injecte, Preservative Fre 02/10/2016  ? PFIZER(Purple Top)SARS-COV-2 Vaccination 02/23/2020, 03/21/2020  ? PNEUMOCOCCAL CONJUGATE-20 09/28/2021  ? Pneumococcal Conjugate-13 08/12/2014  ? Pneumococcal Polysaccharide-23 04/06/2016  ? Td 12/25/2000  ? Tdap 07/18/2010, 02/15/2021  ? ? ?No orders of the defined types were placed in this encounter. ? ?Meds ordered this encounter  ?Medications  ? fluticasone-salmeterol (ADVAIR DISKUS) 250-50 MCG/ACT AEPB  ?  Sig: Inhale 1 puff into the lungs in the morning and at bedtime.  ?  Dispense:  60 each  ?  Refill:  5  ? albuterol (VENTOLIN HFA) 108 (90 Base) MCG/ACT inhaler  ?  Sig: Inhale 1-2 puffs into the lungs every 6 (six) hours as needed for wheezing or shortness of breath.  ?  Dispense:  8 g  ?  Refill:  3  ? ? ?No follow-ups on file. July after PFTs ? ?I have spent a total time of45-minutes on the day of the appointment reviewing prior documentation, coordinating care and discussing medical diagnosis and plan with the patient/family. Imaging, labs and tests included in this note have been reviewed and interpreted independently by me. ? ?Roneshia Drew Mechele Collin, MD ?[facility] ?04/28/2022 11:49 AM  ?Office Number [phone] ? ? ?

## 2022-05-01 LAB — IGE: IgE (Immunoglobulin E), Serum: 192 kU/L — ABNORMAL HIGH (ref <=114)

## 2022-05-04 ENCOUNTER — Other Ambulatory Visit: Payer: Self-pay | Admitting: Orthopedic Surgery

## 2022-05-04 DIAGNOSIS — M25552 Pain in left hip: Secondary | ICD-10-CM

## 2022-05-06 ENCOUNTER — Telehealth: Payer: Self-pay | Admitting: Pulmonary Disease

## 2022-05-06 NOTE — Telephone Encounter (Signed)
Ms. Peeler, ? ?I have reviewed your labs and the IgE and eosinophil count confirm that you likely have an allergic type asthma. I recommend starting a biologic in the future if your inhalers are not controlling your current symptoms. We can discuss this at our next visit. ? ?I will have staff contact you to schedule appointment. ? ?Kind regards, ?Dr. Everardo All ?

## 2022-05-09 ENCOUNTER — Other Ambulatory Visit: Payer: Medicare Other

## 2022-05-10 NOTE — Telephone Encounter (Signed)
Scheduled for 05/15/22. ?

## 2022-05-11 ENCOUNTER — Ambulatory Visit
Admission: RE | Admit: 2022-05-11 | Discharge: 2022-05-11 | Disposition: A | Discharge status: Home / Self Care | Payer: Medicare Other | Source: Ambulatory Visit | Attending: Orthopedic Surgery | Admitting: Orthopedic Surgery

## 2022-05-11 DIAGNOSIS — M25552 Pain in left hip: Secondary | ICD-10-CM

## 2022-05-15 ENCOUNTER — Ambulatory Visit (INDEPENDENT_AMBULATORY_CARE_PROVIDER_SITE_OTHER): Payer: Medicare Other | Admitting: Pulmonary Disease

## 2022-05-15 ENCOUNTER — Encounter: Payer: Self-pay | Admitting: Pulmonary Disease

## 2022-05-15 VITALS — BP 150/88 | HR 91 | Temp 97.7°F | Ht 71.0 in | Wt 212.8 lb

## 2022-05-15 DIAGNOSIS — J4530 Mild persistent asthma, uncomplicated: Secondary | ICD-10-CM

## 2022-05-15 MED ORDER — PREDNISONE 10 MG PO TABS: ORAL_TABLET | ORAL | 0 refills | Status: AC

## 2022-05-15 MED ORDER — FLUTICASONE-SALMETEROL 250-50 MCG/ACT IN AEPB: 1.0000 | INHALATION_SPRAY | Freq: Two times a day (BID) | RESPIRATORY_TRACT | 5 refills | Status: DC

## 2022-05-15 MED ORDER — FLUTICASONE-SALMETEROL 230-21 MCG/ACT IN AERO: 2.0000 | INHALATION_SPRAY | Freq: Two times a day (BID) | RESPIRATORY_TRACT | 5 refills | Status: DC

## 2022-05-15 NOTE — Patient Instructions (Addendum)
Mild persistent asthma STOP Advair Diskus START Advair HFA 230-21 mcg TWO puffs TWICE a day (PUFFER) CONTINUE Albuterol AS NEEDED for shortness of breath and wheezing CONTINUE nebulizer AS NEEDED CONTINUE Singulair 10 mg daily  Nasal congestion START flonase 1 spray per nightly STOP Afrin  Multiple subcentimeter pulmonary nodules Borderline mediastinal lymphadenopathy -- Scheduled for repeat CT in July 2023. -- Please call our office to schedule appointment with me once CT is scheduled  Follow-up with me in July

## 2022-05-15 NOTE — Progress Notes (Signed)
Subjective:   PATIENT ID: Sherri Jones GENDER: female DOB: [birth year], MRN: [id number]   HPI  Chief Complaint  Patient presents with   Follow-up    Pt states she has been miserable. Tries to stay inside b/c of allergies. Sinuses pretty bad.     Reason for Visit: Follow-up  [name] is a 71 year old female never smoker with asthma, multiple subcentimeter lung nodules, mild mediastinal adenopathy, hypertension, hyperlipidemia, diabetes and aortic atherosclerosis who presents for evaluation for asthma and wheezing, mediastinal lymphadenopathy pulmonary nodules. She presents with her sister.  Synopsis: She is followed by her PCP at [facility], [place].  Note from 03/29/2022 reviewed.  Has had persistent asthma symptoms despite using Trelegy.   Starting in September she had bronchitis and has had wheezing and chest congestion with sputum production. Shortness of breath with exertion. Symptoms worsen at night. Has nasal congestion. COVID and influenza neg. She has  No prior bronchitis but reports asthma in her 24s and used an albuterol inhaler and very used it except when exposed to smoke or irritants. Currently uses albuterol nebulizer once a day. Does not have handheld albuterol. Her symptoms have limited her activity including reducing distances.  She has tried Trelegy for 3 months but does not feel like it lasts.  05/15/22 Since her last visit she was transition from Trelegy to medium dose Advair. Has had difficult with the powder inhaler. It does have some benefit including improved shortness of breath and cough. Nighttime symptoms still are unchanged including wheezing and coughing. Continues to use albuterol twice during the day and once every nightly. Uses nebulizer daily and sometimes at night. She continues to have sinus congestion. Has been using afrin for years.  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[employer], used chemicals x 12 years Frequent use of silver dust for ants No pets  Past Medical History:  Diagnosis Date   Chronic back pain    Hyperlipemia      Family History  Problem Relation Age of Onset   Cerebral aneurysm Mother 85   Heart attack Father 26       Died age 35     Social History   Occupational History   Not on file  Tobacco Use   Smoking status: Never   Smokeless tobacco: Never  Substance and Sexual Activity   Alcohol use: No   Drug use: Not on file   Sexual activity: Not on file    No Known Allergies   Outpatient Medications Prior to Visit  Medication Sig Dispense Refill   albuterol (VENTOLIN HFA) 108 (90 Base) MCG/ACT inhaler Inhale 1-2 puffs into the lungs every 6 (six) hours as needed for wheezing or shortness of breath. 8 g 3   aspirin 81 MG tablet Take 81 mg by mouth daily.     diclofenac Sodium (VOLTAREN) 1 % GEL Apply 2 g topically 4 (four) times daily as needed (pain).     fluticasone-salmeterol (ADVAIR DISKUS) 250-50 MCG/ACT AEPB Inhale 1 puff into the lungs in the morning and at bedtime. 60 each 5   gabapentin (NEURONTIN) 300 MG capsule Take 300 mg by mouth 3 (three) times daily.     hydrOXYzine (ATARAX/VISTARIL) 50 MG tablet Take 50 mg by mouth daily. (Patient not taking: Reported on 05/15/2022)     Ibuprofen-Famotidine 800-26.6 MG TABS Take 1 tablet by mouth as  needed (pain). (Patient not taking: Reported on 05/15/2022)     losartan (COZAAR) 100 MG tablet Take 100 mg by mouth daily.     meclizine (ANTIVERT) 12.5 MG tablet Take 1 tablet (12.5 mg total) by mouth 3 (three) times daily as needed for dizziness. (Patient not taking: Reported on 05/15/2022) 30 tablet 0   montelukast (SINGULAIR) 10 MG tablet Take 10 mg by mouth daily.     ondansetron (ZOFRAN ODT) 4 MG disintegrating tablet Take 1 tablet (4 mg total) by mouth every 8 (eight) hours as needed. 20 tablet 0   rosuvastatin (CRESTOR) 10 MG tablet Take 10 mg by  mouth daily.  5   No facility-administered medications prior to visit.    Review of Systems  Constitutional:  Negative for chills, diaphoresis, fever, malaise/fatigue and weight loss.  HENT:  Positive for congestion.   Respiratory:  Positive for cough, shortness of breath and wheezing. Negative for hemoptysis and sputum production.   Cardiovascular:  Negative for chest pain, palpitations and leg swelling.    Objective:   Vitals:   05/15/22 0909  BP: (!) 150/88  Pulse: 91  Temp: 97.7 F (36.5 C)  SpO2: 99%  Weight: 212 lb 12.8 oz (96.5 kg)  Height: 5\' 11"  (1.803 m)   SpO2: 99 % (RA)  Physical Exam: General: Well-appearing, no acute distress HENT: [place], AT Eyes: EOMI, no scleral icterus Respiratory: Expiratory wheezing bilaterally Cardiovascular: RRR, -M/R/G, no JVD Extremities:-Edema,-tenderness Neuro: AAO x4, CNII-XII grossly intact Psych: Normal mood, normal affect  Data Reviewed:  Imaging: CT chest 03/28/2022-(report only).  Impression: 1 mild central bronchial wall thickening with scattered areas of linear atelectasis. 2.  Small mediastinal lymph nodes, borderline enlarged.  Consider chest CT in 3 to 6 months to document stability. 3.  Multiple calcified noncalcified pulmonary nodules bilaterally.  Largest noncalcified is 5 mm.  Follow-up based on Fleischner criteria  PFT: None on file  Labs: CBC    Component Value Date/Time   WBC 13.3 (H) 04/28/2022 1152   RBC 4.26 04/28/2022 1152   HGB 12.7 04/28/2022 1152   HCT 38.4 04/28/2022 1152   PLT 266.0 04/28/2022 1152   MCV 90.3 04/28/2022 1152   MCH 30.5 08/26/2021 0119   MCHC 33.1 04/28/2022 1152   RDW 13.5 04/28/2022 1152   LYMPHSABS 2.9 04/28/2022 1152   MONOABS 1.0 04/28/2022 1152   EOSABS 0.7 04/28/2022 1152   BASOSABS 0.1 04/28/2022 1152   Absolute eos 04/28/22 700 IgE 04/28/22 192    Assessment & Plan:   Discussion: 71 year old female with asthma, multiple subcentimeter lung nodules, mild mediastinal  adenopathy, hypertension, hyperlipidemia aortic stenosis who presents for follow-up for asthma.  Labs are suggestive of allergic type asthma that may benefit from biologics. Discussed biologic agents including Xolair (anti-IgE), [name] (anti-IL-5) and Dupixent (anti-IL-4 receptor subunit alpha). Will continue to optimize inhaler first since powder base is only partially effective for her. Discussed clinical course and management of asthma including bronchodilator regimen and action plan for exacerbation.  Mild persistent asthma with exacerbation START prednisone taper STOP Advair Diskus START Advair HFA 230-21 mcg TWO puffs TWICE a day CONTINUE Albuterol AS NEEDED for shortness of breath and wheezing CONTINUE nebulizer AS NEEDED CONTINUE Singulair 10 mg daily  Nasal congestion START flonase 1 spray per nightly STOP Afrin  Multiple subcentimeter pulmonary nodules Borderline mediastinal lymphadenopathy Based on available report, suspect infectious/inflammatory etiology. Low suspicion for neoplastic process. Agree with surveillance imaging -- Scheduled for repeat CT in July 2023.  If worsening,  please contact our clinic to discuss management options -- Please call our office to schedule appointment with me once CT is scheduled  Health Maintenance Immunization History  Administered Date(s) Administered   Fluad Quad(high Dose 65+) 10/25/2018, 09/09/2020, 09/28/2021   Influenza Split 10/30/2011, 11/19/2012   Influenza, Seasonal, Injecte, Preservative Fre 02/10/2016   PFIZER(Purple Top)SARS-COV-2 Vaccination 02/23/2020, 03/21/2020   PNEUMOCOCCAL CONJUGATE-20 09/28/2021   Pneumococcal Conjugate-13 08/12/2014   Pneumococcal Polysaccharide-23 04/06/2016   Td 12/25/2000   Tdap 07/18/2010, 02/15/2021    No orders of the defined types were placed in this encounter.  No orders of the defined types were placed in this encounter.   No follow-ups on file. July after PFTs  I have spent a total  time of45-minutes on the day of the appointment reviewing prior documentation, coordinating care and discussing medical diagnosis and plan with the patient/family. Imaging, labs and tests included in this note have been reviewed and interpreted independently by me.  Keera Altidor Mechele Collin, MD [facility] 05/15/2022 9:16 AM  Office Number [phone]

## 2022-07-05 ENCOUNTER — Ambulatory Visit
Admission: RE | Admit: 2022-07-05 | Discharge: 2022-07-05 | Disposition: A | Discharge status: Home / Self Care | Payer: Medicare Other | Source: Ambulatory Visit | Attending: Pulmonary Disease | Admitting: Pulmonary Disease

## 2022-07-05 DIAGNOSIS — R918 Other nonspecific abnormal finding of lung field: Secondary | ICD-10-CM

## 2022-07-05 DIAGNOSIS — J4530 Mild persistent asthma, uncomplicated: Secondary | ICD-10-CM

## 2022-07-21 ENCOUNTER — Encounter: Payer: Self-pay | Admitting: Pulmonary Disease

## 2022-07-21 ENCOUNTER — Ambulatory Visit (INDEPENDENT_AMBULATORY_CARE_PROVIDER_SITE_OTHER): Payer: Medicare Other | Admitting: Pulmonary Disease

## 2022-07-21 VITALS — BP 130/82 | HR 93 | Ht 71.0 in | Wt 209.8 lb

## 2022-07-21 DIAGNOSIS — J4530 Mild persistent asthma, uncomplicated: Secondary | ICD-10-CM | POA: Diagnosis not present

## 2022-07-21 MED ORDER — PREDNISONE 10 MG PO TABS: ORAL_TABLET | ORAL | 0 refills | Status: AC

## 2022-07-21 MED ORDER — ARNUITY ELLIPTA 100 MCG/ACT IN AEPB: 1.0000 | INHALATION_SPRAY | Freq: Every day | RESPIRATORY_TRACT | 5 refills | Status: DC

## 2022-07-21 NOTE — Progress Notes (Signed)
Subjective:   PATIENT ID: Sherri Jones GENDER: female DOB: 1951-06-18, MRN: [id number]   HPI  Chief Complaint  Patient presents with   Follow-up    Reason for Visit: Follow-up  Sherri Jones is a 71 year old female never smoker with asthma, multiple subcentimeter lung nodules, mild mediastinal adenopathy, hypertension, hyperlipidemia, diabetes and aortic atherosclerosis who presents for evaluation for asthma and wheezing, mediastinal lymphadenopathy pulmonary nodules. She presents with her sister.  Synopsis: She is followed by her PCP at [facility] [place].  Note from 03/29/2022 reviewed.  Has had persistent asthma symptoms despite using Trelegy.   Starting in September she had bronchitis and has had wheezing and chest congestion with sputum production. Shortness of breath with exertion. Symptoms worsen at night. Has nasal congestion. COVID and influenza neg. She has  No prior bronchitis but reports asthma in her 5s and used an albuterol inhaler and very used it except when exposed to smoke or irritants. Currently uses albuterol nebulizer once a day. Does not have handheld albuterol. Her symptoms have limited her activity including reducing distances.  She has tried Trelegy for 3 months but does not feel like it lasts.  05/15/22 Since her last visit she was transition from Trelegy to medium dose Advair. Has had difficult with the powder inhaler. It does have some benefit including improved shortness of breath and cough. Nighttime symptoms still are unchanged including wheezing and coughing. Continues to use albuterol twice during the day and once every nightly. Uses nebulizer daily and sometimes at night. She continues to have sinus congestion. Has been using afrin for years.  07/21/22 She switched to increased ICS inhaler and felt initially symptoms improved. However in the last week with the heat her wheezing with productive cough. Associated with shortness of breath. She is  using albuterol inhaler 1-3 times a day. Denies recent outpatient exacerbation or hospitalizations.  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[employer], used chemicals x 12 years Frequent use of silver dust for ants No pets  Past Medical History:  Diagnosis Date   Chronic back pain    Hyperlipemia      Family History  Problem Relation Age of Onset   Cerebral aneurysm Mother 20   Heart attack Father 84       Died age 71     Social History   Occupational History   Not on file  Tobacco Use   Smoking status: Never   Smokeless tobacco: Never  Substance and Sexual Activity   Alcohol use: No   Drug use: Not on file   Sexual activity: Not on file    No Known Allergies   Outpatient Medications Prior to Visit  Medication Sig Dispense Refill   albuterol (VENTOLIN HFA) 108 (90 Base) MCG/ACT inhaler Inhale 1-2 puffs into the lungs every 6 (six) hours as needed for wheezing or shortness of breath. 8 g 3   aspirin 81 MG tablet Take 81 mg by mouth daily.     diclofenac Sodium (VOLTAREN) 1 % GEL Apply 2 g topically 4 (four) times daily as needed (pain).     fluticasone-salmeterol (ADVAIR HFA) 230-21 MCG/ACT inhaler Inhale 2 puffs into the lungs 2 (two) times daily. 1 each 5   gabapentin (NEURONTIN) 300 MG capsule Take 300 mg by mouth 3 (three) times daily.     hydrOXYzine (ATARAX/VISTARIL)  50 MG tablet Take 50 mg by mouth daily.     Ibuprofen-Famotidine 800-26.6 MG TABS Take 1 tablet by mouth as needed (pain).     losartan (COZAAR) 100 MG tablet Take 100 mg by mouth daily.     meclizine (ANTIVERT) 12.5 MG tablet Take 1 tablet (12.5 mg total) by mouth 3 (three) times daily as needed for dizziness. 30 tablet 0   montelukast (SINGULAIR) 10 MG tablet Take 10 mg by mouth daily.     ondansetron (ZOFRAN ODT) 4 MG disintegrating tablet Take 1 tablet (4 mg total) by mouth every 8 (eight)  hours as needed. 20 tablet 0   rosuvastatin (CRESTOR) 10 MG tablet Take 10 mg by mouth daily.  5   No facility-administered medications prior to visit.    Review of Systems  Constitutional:  Negative for chills, diaphoresis, fever, malaise/fatigue and weight loss.  HENT:  Negative for congestion.   Respiratory:  Positive for cough, sputum production, shortness of breath and wheezing. Negative for hemoptysis.   Cardiovascular:  Negative for chest pain, palpitations and leg swelling.     Objective:   Vitals:   07/21/22 1128  BP: 130/82  Pulse: 93  SpO2: 97%  Weight: 209 lb 12.8 oz (95.2 kg)  Height: 5\' 11"  (1.803 m)   SpO2: 97 % O2 Device: None (Room air)  Physical Exam: General: Well-appearing, no acute distress HENT: [place], AT Eyes: EOMI, no scleral icterus Respiratory: Expiratory wheezing bilaterally Cardiovascular: RRR, -M/R/G, no JVD Extremities:-Edema,-tenderness Neuro: AAO x4, CNII-XII grossly intact Psych: Normal mood, normal affect  Data Reviewed:  Imaging: CT chest 03/28/2022-(report only).  Impression: 1 mild central bronchial wall thickening with scattered areas of linear atelectasis. 2.  Small mediastinal lymph nodes, borderline enlarged.  Consider chest CT in 3 to 6 months to document stability. 3.  Multiple calcified noncalcified pulmonary nodules bilaterally.  Largest noncalcified is 5 mm.  Follow-up based on Fleischner criteria CT Chest 07/05/22 - No enlarged mediastinal lymph nodes. Unchanged subcentimeter nodules with largest 91mm  PFT: None on file  Labs: CBC    Component Value Date/Time   WBC 13.3 (H) 04/28/2022 1152   RBC 4.26 04/28/2022 1152   HGB 12.7 04/28/2022 1152   HCT 38.4 04/28/2022 1152   PLT 266.0 04/28/2022 1152   MCV 90.3 04/28/2022 1152   MCH 30.5 08/26/2021 0119   MCHC 33.1 04/28/2022 1152   RDW 13.5 04/28/2022 1152   LYMPHSABS 2.9 04/28/2022 1152   MONOABS 1.0 04/28/2022 1152   EOSABS 0.7 04/28/2022 1152   BASOSABS 0.1  04/28/2022 1152   Absolute eos 04/28/22 700 IgE 04/28/22 192    Assessment & Plan:   Discussion: 71 year old female with asthma, multiple subcentimeter lung nodules, HTN, HLD, aortic stenosis who presents for follow-up. Uncontrolled asthma with frequent exacerbations with >3 in the last 6 months. Discussed biologic agents including Xolair (anti-IgE), [name] (anti-IL-5), Fasenra (anti-IL-5 receptor alpha) and Dupixent (anti-IL-4 receptor subunit alpha). I believe patient would benefit from biologic agent given uncontrolled symptoms, multiple exacerbations and eosinophilia. We discussed the risks and benefits of these type of medications including anaphylaxis. Patient expressed understanding and would like to pursue treatment if eligible. She states she does not like frequent injections. Would be appropriate candidate for [name].  Prior inhalers: Intolerant to powder inhalers due to severe cough  Mild persistent asthma with exacerbation, remains symptomatic ENROLL in [name] START prednisone taper as instructed CONTINUE Advair HFA 230-21 mcg TWO puffs TWICE a day START Arnuity 100 ONE  puff ONCE a day CONTINUE Albuterol AS NEEDED for shortness of breath and wheezing CONTINUE nebulizer AS NEEDED CONTINUE Singulair 10 mg daily  Nasal congestion START flonase 1 spray per nightly STOP Afrin  Multiple subcentimeter pulmonary nodules - stabled since 2016 Borderline mediastinal lymphadenopathy - resolved --No further imaging indicated  Health Maintenance Immunization History  Administered Date(s) Administered   Fluad Quad(high Dose 65+) 10/25/2018, 09/09/2020, 09/28/2021   Influenza Split 10/30/2011, 11/19/2012   Influenza, Seasonal, Injecte, Preservative Fre 02/10/2016   PFIZER(Purple Top)SARS-COV-2 Vaccination 02/23/2020, 03/21/2020   PNEUMOCOCCAL CONJUGATE-20 09/28/2021   Pneumococcal Conjugate-13 08/12/2014   Pneumococcal Polysaccharide-23 04/06/2016   Td 12/25/2000   Tdap 07/18/2010,  02/15/2021    No orders of the defined types were placed in this encounter.  Meds ordered this encounter  Medications   predniSONE (DELTASONE) 10 MG tablet    Sig: Take 4 tablets (40 mg total) by mouth daily with breakfast for 2 days, THEN 3 tablets (30 mg total) daily with breakfast for 2 days, THEN 2 tablets (20 mg total) daily with breakfast for 2 days, THEN 1 tablet (10 mg total) daily with breakfast for 2 days.    Dispense:  20 tablet    Refill:  0   Fluticasone Furoate (ARNUITY ELLIPTA) 100 MCG/ACT AEPB    Sig: Inhale 1 puff into the lungs daily.    Dispense:  30 each    Refill:  5    Return in about 3 months (around 10/21/2022).   I have spent a total time of 50-minutes on the day of the appointment including chart review, data review, collecting history, coordinating care and discussing medical diagnosis and plan with the patient/family. Past medical history, allergies, medications were reviewed. Pertinent imaging, labs and tests included in this note have been reviewed and interpreted independently by me.  Sherri Jones Mechele Collin, MD [facility] 07/21/2022  Office Number [phone]

## 2022-07-21 NOTE — Patient Instructions (Addendum)
  Mild persistent asthma with exacerbation, remains symptomatic ENROLL in [name] START prednisone taper as instructed CONTINUE Advair HFA 230-21 mcg TWO puffs TWICE a day START Arnuity 100 ONE puff ONCE a day CONTINUE Albuterol AS NEEDED for shortness of breath and wheezing CONTINUE nebulizer AS NEEDED CONTINUE Singulair 10 mg daily  Multiple subcentimeter pulmonary nodules - stabled since 2016 Borderline mediastinal lymphadenopathy - resolved --No further imaging indicated  Follow-up with me in 3 months

## 2022-07-24 ENCOUNTER — Telehealth: Payer: Self-pay

## 2022-07-24 ENCOUNTER — Other Ambulatory Visit (HOSPITAL_COMMUNITY): Payer: Self-pay

## 2022-07-24 NOTE — Telephone Encounter (Signed)
Received New start paperwork for [name]. Will update as we work through the benefits process.  Submitted a Prior Authorization request to [facility] for [name] via CoverMyMeds. Will update once we receive a response.   Key: Sherri [id number]

## 2022-07-24 NOTE — Telephone Encounter (Signed)
Received notification from [facility] regarding a prior authorization for [name]. Authorization has been APPROVED from 07/24/2022 to 01/24/2023.   Per test claim, copay for 28 days supply is $0.00  Patient can fill through [facility]: [phone]   Authorization # [id number]

## 2022-07-25 ENCOUNTER — Other Ambulatory Visit (HOSPITAL_COMMUNITY): Payer: Self-pay

## 2022-07-25 MED ORDER — NUCALA 100 MG/ML ~~LOC~~ SOAJ
100.0000 mg | SUBCUTANEOUS | 0 refills | Status: DC
  Filled 2022-07-25: qty 1, 28d supply, fill #0

## 2022-07-25 NOTE — Telephone Encounter (Addendum)
ATC patient to schedule [name] new start visit - left VM requesting return call. Rx sent to [facility] to be couriered to clinic preemptively before 08/09/22 (since I will be OOO through 08/08/22)  Chesley Mires, PharmD, MPH, BCPS, CPP Clinical Pharmacist (Rheumatology and Pulmonology)

## 2022-07-26 ENCOUNTER — Other Ambulatory Visit (HOSPITAL_COMMUNITY): Payer: Self-pay

## 2022-07-26 NOTE — Telephone Encounter (Signed)
Delivery instructions have been updated in [place], medication will be couriered to [facility] by 08/04/22.  Rx has been processed in [facility] and the patient has no copay at this time.

## 2022-07-26 NOTE — Telephone Encounter (Signed)
ATC patient to schedule [name] new start. Left VM requesting return call  Chesley Mires, PharmD, MPH, BCPS, CPP Clinical Pharmacist (Rheumatology and Pulmonology)

## 2022-07-27 NOTE — Telephone Encounter (Signed)
Patient scheduled for [name] new start on 08/10/22. Rx will be couriered from [facility].  Chesley Mires, PharmD, MPH, BCPS, CPP Clinical Pharmacist (Rheumatology and Pulmonology)

## 2022-08-03 ENCOUNTER — Other Ambulatory Visit (HOSPITAL_COMMUNITY): Payer: Self-pay

## 2022-08-04 NOTE — Telephone Encounter (Signed)
[name] has been received and placed in fridge.

## 2022-08-10 ENCOUNTER — Other Ambulatory Visit (HOSPITAL_COMMUNITY): Payer: Self-pay

## 2022-08-10 ENCOUNTER — Ambulatory Visit: Payer: Medicare Other | Admitting: Pharmacist

## 2022-08-10 DIAGNOSIS — Z79899 Other long term (current) drug therapy: Secondary | ICD-10-CM

## 2022-08-10 DIAGNOSIS — Z7189 Other specified counseling: Secondary | ICD-10-CM

## 2022-08-10 DIAGNOSIS — J4530 Mild persistent asthma, uncomplicated: Secondary | ICD-10-CM

## 2022-08-10 MED ORDER — NUCALA 100 MG/ML ~~LOC~~ SOAJ
100.0000 mg | SUBCUTANEOUS | 5 refills | Status: DC
  Filled 2022-08-10 – 2022-08-29 (×2): qty 1, 28d supply, fill #0
  Filled 2022-09-26: qty 1, 28d supply, fill #1
  Filled 2022-10-27: qty 1, 28d supply, fill #2
  Filled 2022-11-22: qty 1, 28d supply, fill #3
  Filled 2022-12-29: qty 1, 28d supply, fill #4
  Filled 2023-01-26: qty 1, 28d supply, fill #5

## 2022-08-10 NOTE — Progress Notes (Signed)
j  HPI Patient presents today to [facility] to see pharmacy team for [facility] new start. She is joined by her sister, Velna Hatchet.  Past medical history includes mild peristent asthma, HTN, hyperlipidemia, and aortic atherosclerosis. She reports use of her albuterol rescue inhaler about 3-4 times a day which she attributes to the extreme heat  She is naive to injectable medications - she is a little nervous to administer an injection, but is open to trial of [name] if there is possibility it will help with her asthma control. She was last seen by Dr. Everardo All on 07/21/22.  Respiratory Medications Current regimen: Advair HFA 230/21 mcg (2 puffs twice daily), montelukast 10mg  nightly Patient reports no known adherence challenges  OBJECTIVE No Known Allergies  Outpatient Encounter Medications as of 08/10/2022  Medication Sig   albuterol (VENTOLIN HFA) 108 (90 Base) MCG/ACT inhaler Inhale 1-2 puffs into the lungs every 6 (six) hours as needed for wheezing or shortness of breath.   aspirin 81 MG tablet Take 81 mg by mouth daily.   diclofenac Sodium (VOLTAREN) 1 % GEL Apply 2 g topically 4 (four) times daily as needed (pain).   fluticasone-salmeterol (ADVAIR HFA) 230-21 MCG/ACT inhaler Inhale 2 puffs into the lungs 2 (two) times daily.   losartan (COZAAR) 100 MG tablet Take 100 mg by mouth daily.   Mepolizumab ([name]) 100 MG/ML SOAJ Inject 1 mL (100 mg total) into the skin every 28 (twenty-eight) days.   montelukast (SINGULAIR) 10 MG tablet Take 10 mg by mouth daily.   rosuvastatin (CRESTOR) 10 MG tablet Take 10 mg by mouth daily.   [DISCONTINUED] Fluticasone Furoate (ARNUITY ELLIPTA) 100 MCG/ACT AEPB Inhale 1 puff into the lungs daily.   [DISCONTINUED] gabapentin (NEURONTIN) 300 MG capsule Take 300 mg by mouth 3 (three) times daily.   [DISCONTINUED] hydrOXYzine (ATARAX/VISTARIL) 50 MG tablet Take 50 mg by mouth daily.   [DISCONTINUED] Ibuprofen-Famotidine 800-26.6 MG TABS Take 1 tablet by mouth  as needed (pain).   [DISCONTINUED] meclizine (ANTIVERT) 12.5 MG tablet Take 1 tablet (12.5 mg total) by mouth 3 (three) times daily as needed for dizziness.   [DISCONTINUED] Mepolizumab ([name]) 100 MG/ML SOAJ Inject 1 mL (100 mg total) into the skin every 28 (twenty-eight) days. Courier to pulm: [address], [address], [place]. Appt on 08/09/22   [DISCONTINUED] ondansetron (ZOFRAN ODT) 4 MG disintegrating tablet Take 1 tablet (4 mg total) by mouth every 8 (eight) hours as needed.   No facility-administered encounter medications on file as of 08/10/2022.     Immunization History  Administered Date(s) Administered   Fluad Quad(high Dose 65+) 10/25/2018, 09/09/2020, 09/28/2021   Influenza Split 10/30/2011, 11/19/2012   Influenza, Seasonal, Injecte, Preservative Fre 02/10/2016   PFIZER(Purple Top)SARS-COV-2 Vaccination 02/23/2020, 03/21/2020   PNEUMOCOCCAL CONJUGATE-20 09/28/2021   Pneumococcal Conjugate-13 08/12/2014   Pneumococcal Polysaccharide-23 04/06/2016   Td 12/25/2000   Tdap 07/18/2010, 02/15/2021     PFTs     No data to display          Eosinophils Most recent blood eosinophil count was 700 cells/microL taken on 04/28/22.   IgE: 192 on 04/28/22  Assessment   Biologics training for mepolizumab ([name])  Goals of therapy: Mechanism of Action: Not fully understood. It does act an interleukin-5 (IL-5) antagonist monoclonal antibody that reduces the production and survival of eosinophils by blocking the binding of IL-5 to the alpha chain of the receptor complex on the eosinophil cell surface. Reviewed that [name] is add-on medication and patient  must continue maintenance inhaler regimen. Response to therapy: may take 3 months to 6 months to determine efficacy. Discussed that patients generally feel improvement sooner than 3 months.  Side effects: headache (19%), injection site reaction (7-15%), antibody development (6%), backache (5%), fatigue (5%)  Dose: 100  mg subcutaneously every 4 weeks  Administration/Storage:  Reviewed administration sites of thigh or abdomen (at least 2-3 inches away from abdomen). Reviewed the upper arm is only appropriate if caregiver is administering injection  Do not shake the reconstituted solution as this could lead to product foaming or precipitation. Solution should be clear to opalescent and colorless to pale yellow or pale brown, essentially particle free. Small air bubbles, however, are expected and acceptable. If particulate matter remains in the solution or if the solution appears cloudy or milky, discard the solution.  Reviewed storage of medication in refrigerator. Reviewed that [name] can be stored at room temperature in unopened carton for up to 7 days.  Access: Approval of [name] through: insurance  Patient self-administered [name] 100mg /mL in left thigh using [facility]-supplied [name] 100mg /mL autoinjector pen NDC: [phone] Lot: Expiration: 12/2024  Patient monitored for 30 minutes for adverse reaction.  Patient tolerated without issue. Reported pain during injection itself. Injection site checked and no irritation or itchiness noted by patient. No redness or bump noticed by provider  Medication Reconciliation  A drug regimen assessment was performed, including review of allergies, interactions, disease-state management, dosing and immunization history. Medications were reviewed with the patient, including name, instructions, indication, goals of therapy, potential side effects, importance of adherence, and safe use.  Drug interaction(s): none noted  Immunizations  She is UTD on influenza vaccine and pneumonia vaccine. She is eligible for zoster vaccine - reviewed importance of receiving two-dose series  PLAN Continue [name] 100mg  every 4 weeks. Next dose is due 09/07/2022 and every 4 weeks thereafter. Rx sent to: [facility]: [phone] . Patient provided with  pharmacy phone number and advised that they will call her in 2-3 weeks to schedule shipment to home.  Continue maintenance asthma regimen of: Advair HFA 230/21 mcg (2 puffs twice daily), montelukast 10mg  nightly  All questions encouraged and answered.  Instructed patient to reach out with any further questions or concerns.  Thank you for allowing pharmacy to participate in this patient's care.  This appointment required 45 minutes of patient care (this includes precharting, chart review, review of results, face-to-face care, etc.).  09/09/2022, PharmD, MPH, BCPS, CPP Clinical Pharmacist (Rheumatology and Pulmonology)

## 2022-08-10 NOTE — Patient Instructions (Signed)
Consider getting the shingles vaccine due to your asthma being an immunocompromising condition. It is a two-dose series (the two doses are 4 weeks to 6 months apart)  Your next [name] dose is due on 09/07/22, 10/05/22, and every 4 weeks thereafter  CONTINUE Advair HFA as prescribed with montelukast 10mg  daily  Your prescription will be shipped from [facility]. Their phone number is [phone] They will call you to schedule and mail your medication to your home.  You will need to be seen by your provider in 3 to 4 months to assess how [name] is working for you. Please ensure you have a follow-up appointment scheduled in November or December 2023. Call our clinic if you need to make this appointment.  How to manage an injection site reaction: Remember the 5 C's: COUNTER - leave on the counter at least 30 minutes but up to overnight to bring medication to room temperature. This may help prevent stinging COLD - place something cold (like an ice gel pack or cold water bottle) on the injection site just before cleansing with alcohol. This may help reduce pain CLARITIN - use Claritin (generic name is loratadine) for the first two weeks of treatment or the day of, the day before, and the day after injecting. This will help to minimize injection site reactions CORTISONE CREAM - apply if injection site is irritated and itching CALL ME - if injection site reaction is bigger than the size of your fist, looks infected, blisters, or if you develop hives

## 2022-08-11 ENCOUNTER — Other Ambulatory Visit: Payer: Self-pay | Admitting: Pulmonary Disease

## 2022-08-11 IMAGING — CT CT HIP*L* W/O CM
1 series · 14 of 16 positions shown, 18 images · non-contrast
Comparison: None Available.

CLINICAL DATA: Status post fall, left hip pain for 3 weeks

EXAM:
CT OF THE LEFT HIP WITHOUT CONTRAST
TECHNIQUE: Multidetector CT imaging of the left hip was performed according to
the standard protocol. Multiplanar CT image reconstructions were
also generated.
RADIATION DOSE REDUCTION: This exam was performed according to the
departmental dose-optimization program which includes automated
exposure control, adjustment of the mA and/or kV according to
patient size and/or use of iterative reconstruction technique.

[Series 4: soft tissue lower extremity · axial · 0.48mm/px · z∈[-362,-108]mm · 14 of 147 slices shown, 18 images]
[im 10/147  soft-tissue]
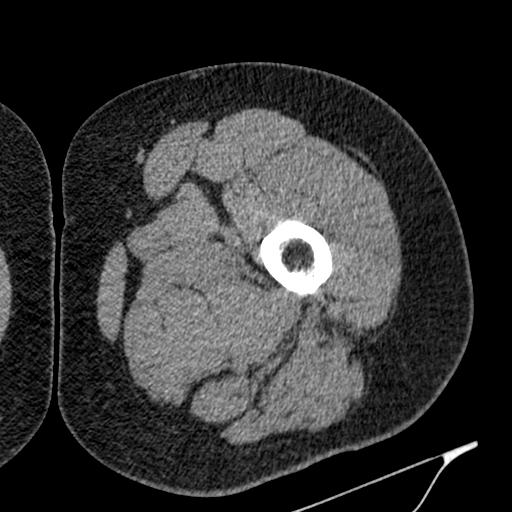
[im 10/147  bone]
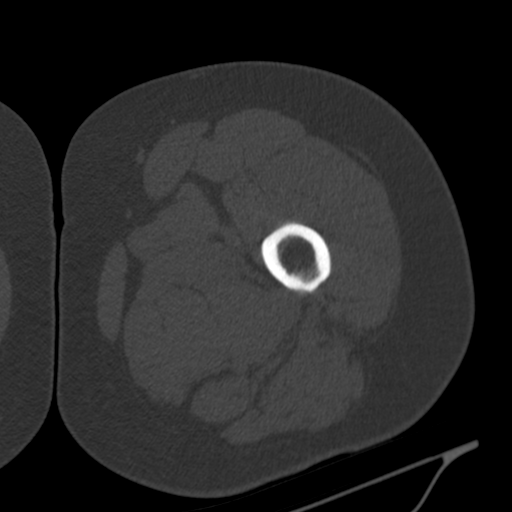
[im 20/147  soft-tissue]
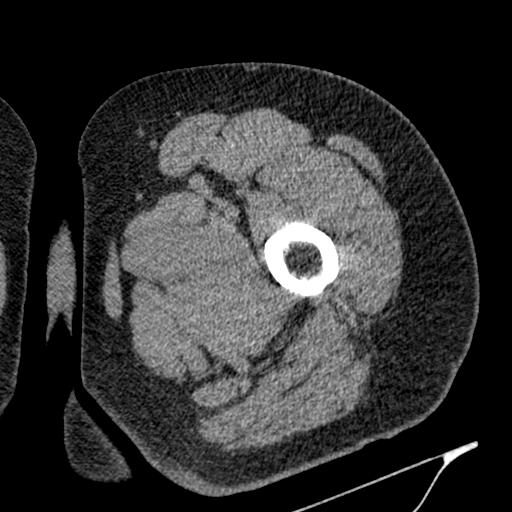
[im 30/147  soft-tissue]
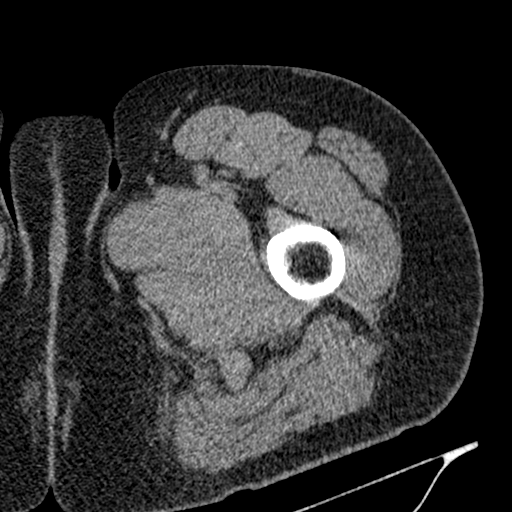
[im 39/147  soft-tissue]
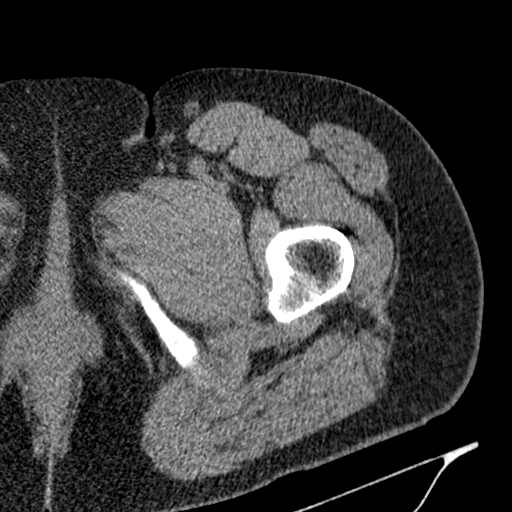
[im 49/147  soft-tissue]
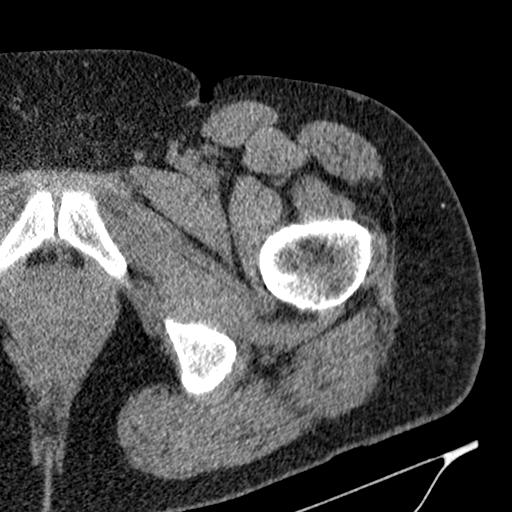
[im 49/147  bone]
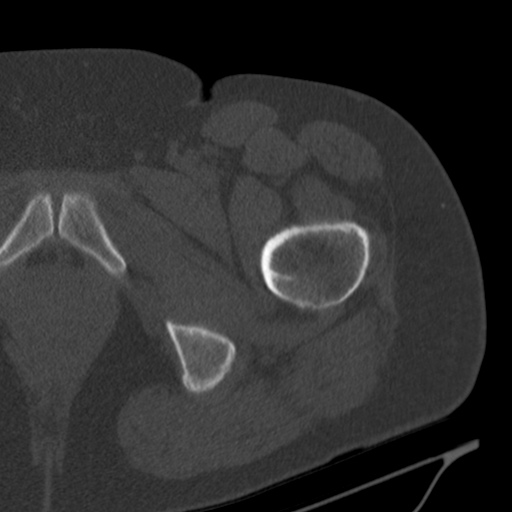
[im 59/147  soft-tissue]
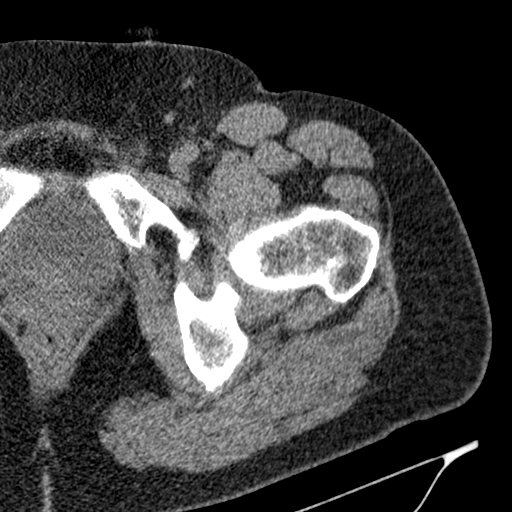
[im 69/147  soft-tissue]
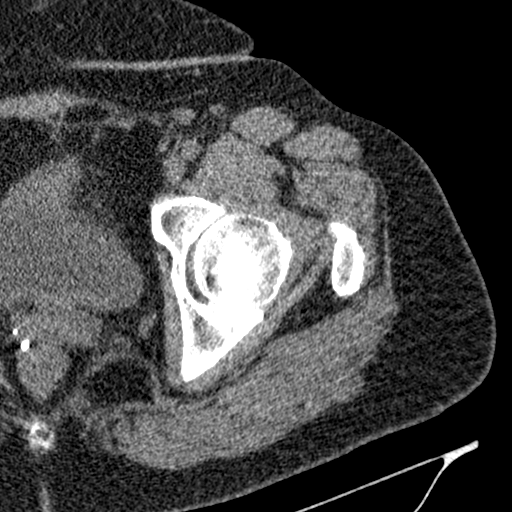
[im 78/147  soft-tissue]
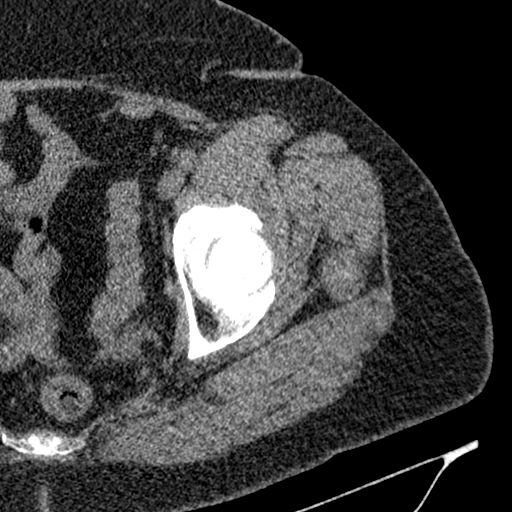
[im 88/147  soft-tissue]
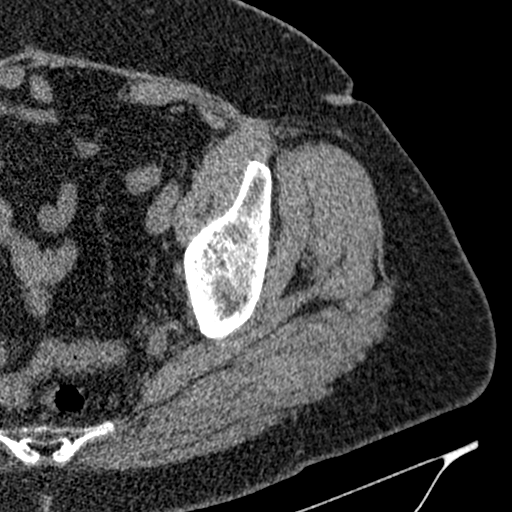
[im 88/147  bone]
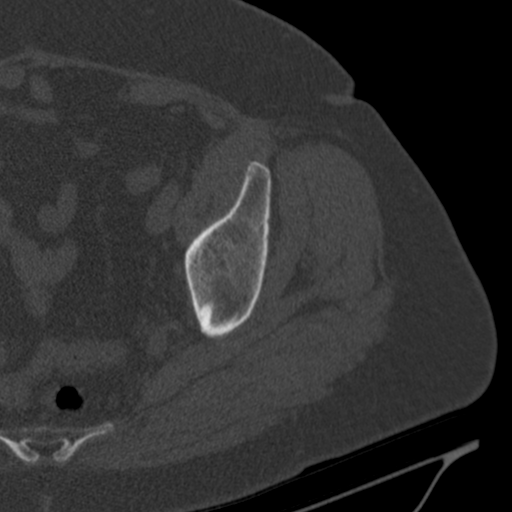
[im 98/147  soft-tissue]
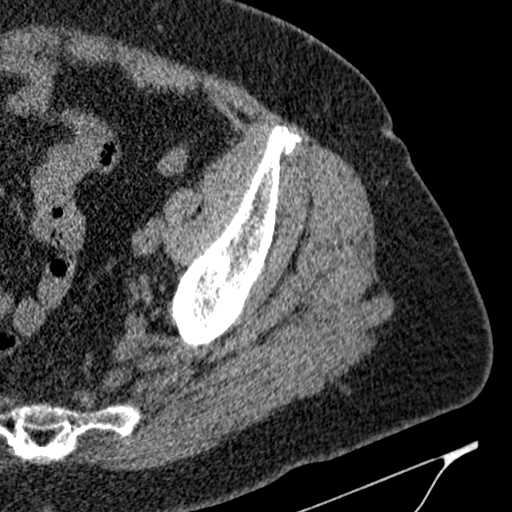
[im 108/147  soft-tissue]
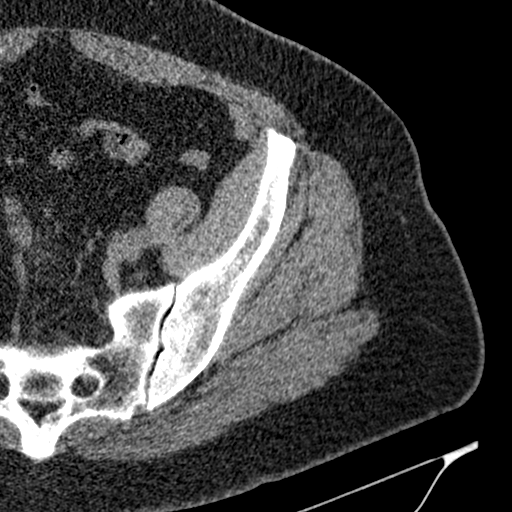
[im 117/147  soft-tissue]
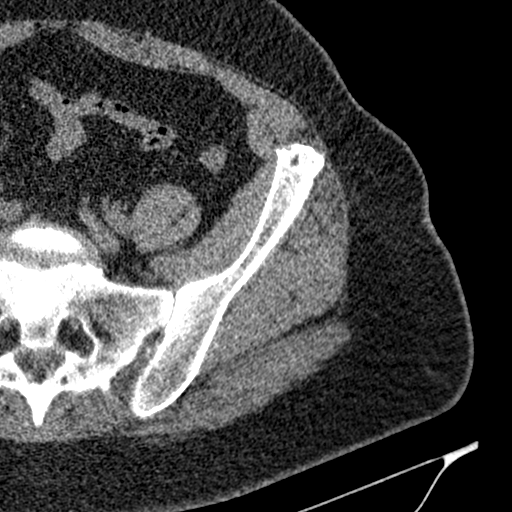
[im 127/147  soft-tissue]
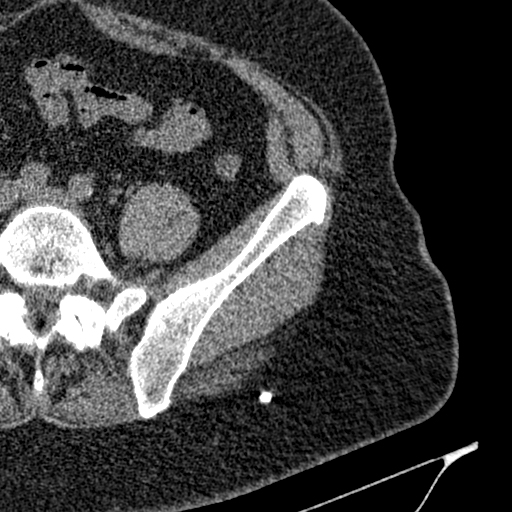
[im 127/147  bone]
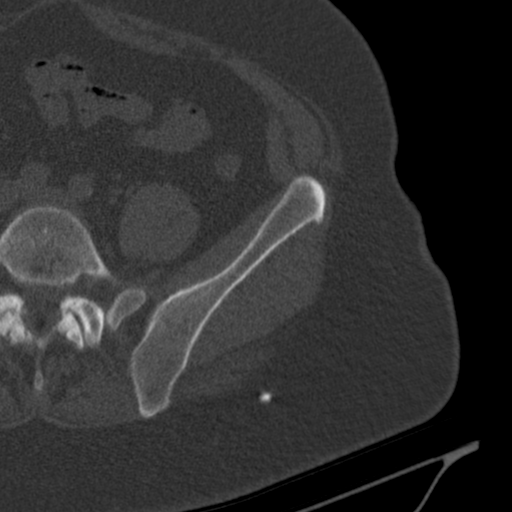
[im 137/147  soft-tissue]
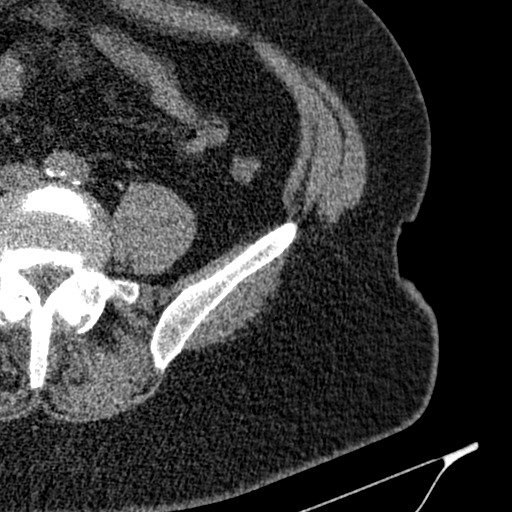

[14 of 16 positions shown; findings below may reference images not displayed]

FINDINGS: Bones/Joint/Cartilage

No fracture or dislocation. Normal alignment. No joint effusion.
Mild osteoarthritis of the left hip. Moderate bilateral facet
arthropathy at L4-5 and L5-S1. Degenerative disease with disc height
loss at L5-S1. Severe spinal stenosis at L4-5. Small bone island in
the left superior pubic ramus. Sclerotic bone lesion in the S1
vertebral body unchanged compared with 09/12/2010 likely reflecting
a benign chondroid lesion or atypical hemangioma.

Ligaments

Ligaments are suboptimally evaluated by CT.

Muscles and Tendons
Muscles are normal. No muscle atrophy. No intramuscular fluid
collection or hematoma.

Soft tissue
No fluid collection or hematoma. No soft tissue mass. Abdominal
aortic atherosclerosis.
IMPRESSION: 1. No acute fracture or dislocation of the left hip.
2. Mild osteoarthritis of the left hip.
3. Moderate bilateral facet arthropathy at L4-5 and L5-S1. Severe
spinal stenosis at L4-5.
4.  Aortic Atherosclerosis ([id number]).

## 2022-08-25 ENCOUNTER — Other Ambulatory Visit (HOSPITAL_COMMUNITY): Payer: Self-pay

## 2022-08-29 ENCOUNTER — Other Ambulatory Visit (HOSPITAL_COMMUNITY): Payer: Self-pay

## 2022-08-31 ENCOUNTER — Other Ambulatory Visit (HOSPITAL_COMMUNITY): Payer: Self-pay

## 2022-09-26 ENCOUNTER — Other Ambulatory Visit (HOSPITAL_COMMUNITY): Payer: Self-pay

## 2022-10-03 ENCOUNTER — Other Ambulatory Visit (HOSPITAL_COMMUNITY): Payer: Self-pay

## 2022-10-27 ENCOUNTER — Other Ambulatory Visit (HOSPITAL_COMMUNITY): Payer: Self-pay

## 2022-10-31 ENCOUNTER — Other Ambulatory Visit (HOSPITAL_COMMUNITY): Payer: Self-pay

## 2022-11-08 ENCOUNTER — Ambulatory Visit (HOSPITAL_BASED_OUTPATIENT_CLINIC_OR_DEPARTMENT_OTHER): Payer: Medicare Other | Admitting: Pulmonary Disease

## 2022-11-22 ENCOUNTER — Other Ambulatory Visit (HOSPITAL_COMMUNITY): Payer: Self-pay

## 2022-12-04 ENCOUNTER — Other Ambulatory Visit (HOSPITAL_COMMUNITY): Payer: Self-pay

## 2022-12-05 ENCOUNTER — Other Ambulatory Visit (HOSPITAL_COMMUNITY): Payer: Self-pay

## 2022-12-06 ENCOUNTER — Ambulatory Visit (HOSPITAL_BASED_OUTPATIENT_CLINIC_OR_DEPARTMENT_OTHER): Payer: Medicare Other | Admitting: Pulmonary Disease

## 2022-12-27 ENCOUNTER — Other Ambulatory Visit (HOSPITAL_COMMUNITY): Payer: Self-pay

## 2022-12-29 ENCOUNTER — Other Ambulatory Visit (HOSPITAL_COMMUNITY): Payer: Self-pay

## 2023-01-03 ENCOUNTER — Other Ambulatory Visit: Payer: Self-pay

## 2023-01-26 ENCOUNTER — Other Ambulatory Visit (HOSPITAL_COMMUNITY): Payer: Self-pay

## 2023-02-02 ENCOUNTER — Other Ambulatory Visit (HOSPITAL_COMMUNITY): Payer: Self-pay

## 2023-02-27 ENCOUNTER — Other Ambulatory Visit (HOSPITAL_COMMUNITY): Payer: Self-pay

## 2023-02-27 ENCOUNTER — Other Ambulatory Visit: Payer: Self-pay | Admitting: Pulmonary Disease

## 2023-02-27 DIAGNOSIS — J4530 Mild persistent asthma, uncomplicated: Secondary | ICD-10-CM

## 2023-03-02 ENCOUNTER — Other Ambulatory Visit (HOSPITAL_COMMUNITY): Payer: Self-pay

## 2023-03-02 ENCOUNTER — Other Ambulatory Visit: Payer: Self-pay | Admitting: Pulmonary Disease

## 2023-03-02 DIAGNOSIS — J4530 Mild persistent asthma, uncomplicated: Secondary | ICD-10-CM

## 2023-03-05 ENCOUNTER — Other Ambulatory Visit (HOSPITAL_COMMUNITY): Payer: Self-pay

## 2023-03-05 ENCOUNTER — Telehealth: Payer: Self-pay | Admitting: Pulmonary Disease

## 2023-03-05 DIAGNOSIS — J4530 Mild persistent asthma, uncomplicated: Secondary | ICD-10-CM

## 2023-03-05 MED ORDER — NUCALA 100 MG/ML ~~LOC~~ SOAJ
100.0000 mg | SUBCUTANEOUS | 0 refills | Status: DC
  Filled 2023-03-05: qty 1, 28d supply, fill #0

## 2023-03-05 NOTE — Telephone Encounter (Signed)
Refill sent for [name] to [facility]: [phone]  for one pen only. She is due for refill tomorrow so will not be sending further refills until f/u appt is completed  Dose: 100 mg SQ every 4 weeks  Last OV: 07/21/22 before she started [name] and multiple no shows since then Provider: Dr. Loanne Drilling  Next OV: 03/14/2023  Knox Saliva, PharmD, MPH, BCPS Clinical Pharmacist (Rheumatology and Pulmonology)

## 2023-03-06 ENCOUNTER — Other Ambulatory Visit (HOSPITAL_COMMUNITY): Payer: Self-pay

## 2023-03-14 ENCOUNTER — Encounter (HOSPITAL_BASED_OUTPATIENT_CLINIC_OR_DEPARTMENT_OTHER): Payer: Self-pay | Admitting: Pulmonary Disease

## 2023-03-14 ENCOUNTER — Ambulatory Visit (INDEPENDENT_AMBULATORY_CARE_PROVIDER_SITE_OTHER): Payer: 59 | Admitting: Pulmonary Disease

## 2023-03-14 VITALS — BP 128/80 | HR 95 | Ht 71.0 in | Wt 207.4 lb

## 2023-03-14 DIAGNOSIS — J4530 Mild persistent asthma, uncomplicated: Secondary | ICD-10-CM

## 2023-03-14 MED ORDER — FLUTICASONE-SALMETEROL 230-21 MCG/ACT IN AERO: 2.0000 | INHALATION_SPRAY | Freq: Two times a day (BID) | RESPIRATORY_TRACT | 5 refills | Status: DC

## 2023-03-14 NOTE — Patient Instructions (Signed)
Mild persistent asthma - minimal symptoms CONTINUE monthly [name] 07/2022> CONTINUE Advair HFA 230-21 mcg TWO puffs TWICE a day STOP Arnuity. Not currently using it CONTINUE Albuterol AS NEEDED for shortness of breath and wheezing CONTINUE nebulizer AS NEEDED CONTINUE singulair 10 mg daily  Nasal congestion - resolved CONTINUE flonase 1 spray per nightly as needed

## 2023-03-14 NOTE — Progress Notes (Signed)
Subjective:   PATIENT ID: Sherri Jones GENDER: female DOB: 11-Sep-1951, MRN: [id number]   HPI  Chief Complaint  Patient presents with   Follow-up    Every mo before she gets nucala injection she starts wheezing.     Reason for Visit: Follow-up  [name] is a 73 year old female never smoker with asthma, multiple subcentimeter lung nodules, mild mediastinal adenopathy, hypertension, hyperlipidemia, diabetes and aortic atherosclerosis who presents for evaluation for asthma and wheezing, mediastinal lymphadenopathy pulmonary nodules. She presents with her sister.  Synopsis: She is followed by her PCP at [facility], [place].  Note from 03/29/2022 reviewed.  Has had persistent asthma symptoms despite using Trelegy.   Starting in September she had bronchitis and has had wheezing and chest congestion with sputum production. Shortness of breath with exertion. Symptoms worsen at night. Has nasal congestion. COVID and influenza neg. She has  No prior bronchitis but reports asthma in her 63s and used an albuterol inhaler and very used it except when exposed to smoke or irritants. Currently uses albuterol nebulizer once a day. Does not have handheld albuterol. Her symptoms have limited her activity including reducing distances.  She has tried Trelegy for 3 months but does not feel like it lasts.  05/15/22 Since her last visit she was transition from Trelegy to medium dose Advair. Has had difficult with the powder inhaler. It does have some benefit including improved shortness of breath and cough. Nighttime symptoms still are unchanged including wheezing and coughing. Continues to use albuterol twice during the day and once every nightly. Uses nebulizer daily and sometimes at night. She continues to have sinus congestion. Has been using afrin for years.  07/21/22 She switched to increased ICS inhaler and felt initially symptoms improved. However in the last week with the heat her wheezing  with productive cough. Associated with shortness of breath. She is using albuterol inhaler 1-3 times a day. Denies recent outpatient exacerbation or hospitalizations.  03/14/23 Since starting [name] she reports she has been doing well. Improved shortness of breath occurring 3-6 times a week but only needing nebulizer once a week. Will wake up once a week due to symptoms. She will start wheezing right before administration of the injection. Compliant with her Advair. No exacerbations requiring steroids since our last visit  Asthma Control Test ACT Total Score  03/14/2023  9:15 AM 14  07/21/2022 11:30 AM 8  05/15/2022  9:11 AM 11   2023 Jan Feb Mar April May June July Aug Sept Oct Nov Dec      X Maylon Cos      2024 Jan Feb Mar April May June July Aug Sept Oct Nov Dec                2025 Jan Feb Mar April May June July Aug Sept Oct Nov Dec                 O - [name] started  Social History: [place] age [age] [employer], used chemicals x 12 years Frequent use of silver dust for ants No pets Has 10 living siblings. She is #7  Past Medical History:  Diagnosis Date   Chronic back pain    Hyperlipemia      Family History  Problem Relation Age of Onset   Cerebral aneurysm Mother 70   Heart attack Father 67       Died age 52     Social History   Occupational  History   Not on file  Tobacco Use   Smoking status: Never   Smokeless tobacco: Never  Substance and Sexual Activity   Alcohol use: No   Drug use: Not on file   Sexual activity: Not on file    No Known Allergies   Outpatient Medications Prior to Visit  Medication Sig Dispense Refill   albuterol (VENTOLIN HFA) 108 (90 Base) MCG/ACT inhaler INHALE 1 TO 2 PUFFS INTO THE LUNGS EVERY 6 HOURS AS NEEDED FOR WHEEZING OR SHORTNESS OF BREATH 6.7 g 5   aspirin 81 MG tablet Take 81 mg by mouth daily.     losartan (COZAAR) 100 MG tablet Take 100 mg by mouth daily.     Mepolizumab ([name]) 100 MG/ML SOAJ Inject 1 mL (100 mg total)  into the skin every 28 (twenty-eight) days. 1 mL 0   rosuvastatin (CRESTOR) 10 MG tablet Take 10 mg by mouth daily.  5   diclofenac Sodium (VOLTAREN) 1 % GEL Apply 2 g topically 4 (four) times daily as needed (pain).     fluticasone-salmeterol (ADVAIR HFA) 230-21 MCG/ACT inhaler Inhale 2 puffs into the lungs 2 (two) times daily. 1 each 5   montelukast (SINGULAIR) 10 MG tablet Take 10 mg by mouth daily.     No facility-administered medications prior to visit.    Review of Systems  Constitutional:  Negative for chills, diaphoresis, fever, malaise/fatigue and weight loss.  HENT:  Negative for congestion.   Respiratory:  Positive for shortness of breath and wheezing. Negative for cough, hemoptysis and sputum production.   Cardiovascular:  Negative for chest pain, palpitations and leg swelling.     Objective:   Vitals:   03/14/23 0907  BP: 128/80  Pulse: 95  SpO2: 98%  Weight: 207 lb 6.4 oz (94.1 kg)  Height: 5\' 11"  (1.803 m)   SpO2: 98 % O2 Device: None (Room air)  Physical Exam: General: Well-appearing, no acute distress HENT: [place], AT Eyes: EOMI, no scleral icterus Respiratory: Clear to auscultation bilaterally.  No crackles, wheezing or rales Cardiovascular: RRR, -M/R/G, no JVD Extremities:-Edema,-tenderness Neuro: AAO x4, CNII-XII grossly intact Psych: Normal mood, normal affect   Data Reviewed:  Imaging: CT chest 03/28/2022-(report only).  Impression: 1 mild central bronchial wall thickening with scattered areas of linear atelectasis. 2.  Small mediastinal lymph nodes, borderline enlarged.  Consider chest CT in 3 to 6 months to document stability. 3.  Multiple calcified noncalcified pulmonary nodules bilaterally.  Largest noncalcified is 5 mm.  Follow-up based on Fleischner criteria CT Chest 07/05/22 - No enlarged mediastinal lymph nodes. Unchanged subcentimeter nodules with largest 21mm  PFT: None on file  Labs: CBC    Component Value Date/Time   WBC 13.3 (H)  04/28/2022 1152   RBC 4.26 04/28/2022 1152   HGB 12.7 04/28/2022 1152   HCT 38.4 04/28/2022 1152   PLT 266.0 04/28/2022 1152   MCV 90.3 04/28/2022 1152   MCH 30.5 08/26/2021 0119   MCHC 33.1 04/28/2022 1152   RDW 13.5 04/28/2022 1152   LYMPHSABS 2.9 04/28/2022 1152   MONOABS 1.0 04/28/2022 1152   EOSABS 0.7 04/28/2022 1152   BASOSABS 0.1 04/28/2022 1152   Absolute eos 04/28/22 700 IgE 04/28/22 192    Assessment & Plan:   Discussion: 72 year old female with asthma, HTN, HLD, aortic stenosis who presents for follow-up. Asthma with improved control on [name] after starting on 08/10/22. No exacerbations since then. Prefers monthly injections to minimize sticks though I think she would benefit  from [place] for tighter symptom control. Consider increasing ICS dose in the future  Prior inhalers: Intolerant to powder inhalers due to severe cough  Mild persistent asthma - improved/minimal symptoms on [name] CONTINUE monthly [name] 07/2022> CONTINUE Advair HFA 230-21 mcg TWO puffs TWICE a day STOP Arnuity. Not currently using it CONTINUE Albuterol AS NEEDED for shortness of breath and wheezing CONTINUE nebulizer AS NEEDED CONTINUE singulair 10 mg daily  Nasal congestion - resolved CONTINUE flonase 1 spray per nightly as needed  Multiple subcentimeter pulmonary nodules - stabled since 2016 Borderline mediastinal lymphadenopathy - resolved --No further imaging indicated  Health Maintenance Immunization History  Administered Date(s) Administered   Fluad Quad(high Dose 65+) 10/25/2018, 09/09/2020, 09/28/2021   Influenza Split 10/30/2011, 11/19/2012   Influenza, Seasonal, Injecte, Preservative Fre 02/10/2016   PFIZER(Purple Top)SARS-COV-2 Vaccination 02/23/2020, 03/21/2020   PNEUMOCOCCAL CONJUGATE-20 09/28/2021   Pneumococcal Conjugate-13 08/12/2014   Pneumococcal Polysaccharide-23 04/06/2016   Td 12/25/2000   Tdap 07/18/2010, 02/15/2021    No orders of the defined types were placed  in this encounter.  Meds ordered this encounter  Medications   fluticasone-salmeterol (ADVAIR HFA) 230-21 MCG/ACT inhaler    Sig: Inhale 2 puffs into the lungs 2 (two) times daily.    Dispense:  1 each    Refill:  5    Return in about 6 months (around 09/14/2023) for routine follow-up, with Dr. Loanne Drilling.   I have spent a total time of 35-minutes on the day of the appointment including chart review, data review, collecting history, coordinating care and discussing medical diagnosis and plan with the patient/family. Past medical history, allergies, medications were reviewed. Pertinent imaging, labs and tests included in this note have been reviewed and interpreted independently by me.  [place], MD [facility] 03/14/2023  Office Number [phone]

## 2023-03-15 ENCOUNTER — Other Ambulatory Visit (HOSPITAL_COMMUNITY): Payer: Self-pay

## 2023-03-15 ENCOUNTER — Other Ambulatory Visit: Payer: Self-pay

## 2023-03-15 MED ORDER — NUCALA 100 MG/ML ~~LOC~~ SOAJ
100.0000 mg | SUBCUTANEOUS | 5 refills | Status: DC
  Filled 2023-03-15 – 2023-03-29 (×2): qty 1, 28d supply, fill #0
  Filled 2023-05-01: qty 1, 28d supply, fill #1
  Filled 2023-05-30: qty 1, 28d supply, fill #2
  Filled 2023-07-03 – 2023-07-05 (×5): qty 1, 28d supply, fill #3
  Filled 2023-07-27: qty 1, 28d supply, fill #4
  Filled 2023-08-29: qty 1, 28d supply, fill #5

## 2023-03-15 NOTE — Telephone Encounter (Signed)
Refill for [name] send to [facility] today. Patient had OV on 03/14/2023 w dr. Hollie Beach, PharmD, MPH, BCPS, CPP Clinical Pharmacist (Rheumatology and Pulmonology)

## 2023-03-19 ENCOUNTER — Encounter (HOSPITAL_BASED_OUTPATIENT_CLINIC_OR_DEPARTMENT_OTHER): Payer: Self-pay | Admitting: Pulmonary Disease

## 2023-03-28 ENCOUNTER — Other Ambulatory Visit (HOSPITAL_COMMUNITY): Payer: Self-pay

## 2023-03-29 ENCOUNTER — Other Ambulatory Visit: Payer: Self-pay

## 2023-03-29 ENCOUNTER — Other Ambulatory Visit (HOSPITAL_COMMUNITY): Payer: Self-pay

## 2023-04-04 ENCOUNTER — Other Ambulatory Visit (HOSPITAL_COMMUNITY): Payer: Self-pay

## 2023-05-01 ENCOUNTER — Other Ambulatory Visit (HOSPITAL_COMMUNITY): Payer: Self-pay

## 2023-05-04 ENCOUNTER — Other Ambulatory Visit: Payer: Self-pay

## 2023-05-11 ENCOUNTER — Other Ambulatory Visit (HOSPITAL_COMMUNITY): Payer: Self-pay

## 2023-05-30 ENCOUNTER — Other Ambulatory Visit (HOSPITAL_COMMUNITY): Payer: Self-pay

## 2023-06-22 ENCOUNTER — Other Ambulatory Visit: Payer: Self-pay

## 2023-07-03 ENCOUNTER — Other Ambulatory Visit (HOSPITAL_COMMUNITY): Payer: Self-pay

## 2023-07-03 ENCOUNTER — Other Ambulatory Visit: Payer: Self-pay

## 2023-07-04 ENCOUNTER — Other Ambulatory Visit (HOSPITAL_COMMUNITY): Payer: Self-pay

## 2023-07-05 ENCOUNTER — Other Ambulatory Visit: Payer: Self-pay

## 2023-07-05 ENCOUNTER — Other Ambulatory Visit (HOSPITAL_COMMUNITY): Payer: Self-pay

## 2023-07-27 ENCOUNTER — Other Ambulatory Visit: Payer: Self-pay

## 2023-07-27 ENCOUNTER — Ambulatory Visit: Payer: 59 | Admitting: Cardiovascular Disease

## 2023-08-03 ENCOUNTER — Other Ambulatory Visit (HOSPITAL_COMMUNITY): Payer: Self-pay

## 2023-08-22 ENCOUNTER — Other Ambulatory Visit: Payer: Self-pay | Admitting: Pulmonary Disease

## 2023-08-29 ENCOUNTER — Other Ambulatory Visit (HOSPITAL_COMMUNITY): Payer: Self-pay

## 2023-09-04 ENCOUNTER — Other Ambulatory Visit: Payer: Self-pay

## 2023-09-17 ENCOUNTER — Ambulatory Visit (HOSPITAL_BASED_OUTPATIENT_CLINIC_OR_DEPARTMENT_OTHER): Payer: 59 | Admitting: Pulmonary Disease

## 2023-09-17 ENCOUNTER — Encounter (HOSPITAL_BASED_OUTPATIENT_CLINIC_OR_DEPARTMENT_OTHER): Payer: Self-pay | Admitting: Pulmonary Disease

## 2023-09-17 VITALS — BP 148/90 | HR 96 | Ht 71.0 in | Wt 201.0 lb

## 2023-09-17 DIAGNOSIS — J4530 Mild persistent asthma, uncomplicated: Secondary | ICD-10-CM | POA: Diagnosis not present

## 2023-09-17 MED ORDER — FLUTICASONE-SALMETEROL 230-21 MCG/ACT IN AERO: 2.0000 | INHALATION_SPRAY | Freq: Two times a day (BID) | RESPIRATORY_TRACT | 11 refills | Status: DC

## 2023-09-17 MED ORDER — MONTELUKAST SODIUM 10 MG PO TABS: 10.0000 mg | ORAL_TABLET | Freq: Every day | ORAL | 3 refills | Status: DC

## 2023-09-17 MED ORDER — ALBUTEROL SULFATE HFA 108 (90 BASE) MCG/ACT IN AERS: 2.0000 | INHALATION_SPRAY | Freq: Four times a day (QID) | RESPIRATORY_TRACT | 5 refills | Status: DC | PRN

## 2023-09-17 NOTE — Progress Notes (Signed)
Subjective:   PATIENT ID: Sherri Jones GENDER: female DOB: [birth year], MRN: [id number]   HPI  Chief Complaint  Patient presents with   Asthma    Reason for Visit: Follow-up  Sherri Jones is a 72 year old female never smoker with asthma, multiple subcentimeter lung nodules, mild mediastinal adenopathy, hypertension, hyperlipidemia, diabetes and aortic atherosclerosis who presents for evaluation for asthma and wheezing, mediastinal lymphadenopathy pulmonary nodules. She presents with her sister.  Synopsis: She is followed by her PCP at [facility] [place].  Note from 03/29/2022 reviewed.  Has had persistent asthma symptoms despite using Trelegy.   Starting in September she had bronchitis and has had wheezing and chest congestion with sputum production. Shortness of breath with exertion. Symptoms worsen at night. Has nasal congestion. COVID and influenza neg. She has  No prior bronchitis but reports asthma in her 71s and used an albuterol inhaler and very used it except when exposed to smoke or irritants. Currently uses albuterol nebulizer once a day. Does not have handheld albuterol. Her symptoms have limited her activity including reducing distances.  She has tried Trelegy for 3 months but does not feel like it lasts.  05/15/22 Since her last visit she was transition from Trelegy to medium dose Advair. Has had difficult with the powder inhaler. It does have some benefit including improved shortness of breath and cough. Nighttime symptoms still are unchanged including wheezing and coughing. Continues to use albuterol twice during the day and once every nightly. Uses nebulizer daily and sometimes at night. She continues to have sinus congestion. Has been using afrin for years.  07/21/22 She switched to increased ICS inhaler and felt initially symptoms improved. However in the last week with the heat her wheezing with productive cough. Associated with shortness of breath. She is using  albuterol inhaler 1-3 times a day. Denies recent outpatient exacerbation or hospitalizations.  03/14/23 Since starting [name] she reports she has been doing well. Improved shortness of breath occurring 3-6 times a week but only needing nebulizer once a week. Will wake up once a week due to symptoms. She will start wheezing right before administration of the injection. Compliant with her Advair. No exacerbations requiring steroids since our last visit  09/17/23 Since our last visit she reports no exacerbations since our last visit but was treated for walking pneumonia with doxycycline earlier this month that was associated with a cough. Did not need steroids. Denies shortness of breath cough or wheezing. Does have mild wheeze sometimes when laying down which is improved with her rescue inhaler. This is more noticeable after two weeks. Winter is usually her worst time.  Asthma Control Test ACT Total Score  09/17/2023  9:19 AM 18  03/14/2023  9:15 AM 14  07/21/2022 11:30 AM 8   2023 Jan Feb Mar April May June July Aug Sept Oct Nov Dec      X Jessie Foot      2024 Jan Feb Mar April May June July Aug Sept Oct Nov Dec                2025 Jan Feb Mar April May June July Aug Sept Oct Nov Dec                 O - [name] started  Social History: [employer] age [age] [employer], used chemicals x 12 years Frequent use of silver dust for ants No pets Has 10 living siblings. She is #7  Past Medical  History:  Diagnosis Date   Chronic back pain    Hyperlipemia      Family History  Problem Relation Age of Onset   Cerebral aneurysm Mother 49   Heart attack Father 40       Died age 26     Social History   Occupational History   Not on file  Tobacco Use   Smoking status: Never   Smokeless tobacco: Never  Substance and Sexual Activity   Alcohol use: No   Drug use: Not on file   Sexual activity: Not on file    No Known Allergies   Outpatient Medications Prior to Visit  Medication Sig  Dispense Refill   aspirin 81 MG tablet Take 81 mg by mouth daily.     losartan (COZAAR) 100 MG tablet Take 100 mg by mouth daily.     Mepolizumab ([name]) 100 MG/ML SOAJ Inject 1 mL (100 mg total) into the skin every 28 (twenty-eight) days. 1 mL 5   rosuvastatin (CRESTOR) 10 MG tablet Take 10 mg by mouth daily.  5   albuterol (VENTOLIN HFA) 108 (90 Base) MCG/ACT inhaler INHALE 1 TO 2 PUFFS INTO THE LUNGS EVERY 6 HOURS AS NEEDED FOR WHEEZING OR SHORTNESS OF BREATH 6.7 g 5   fluticasone-salmeterol (ADVAIR HFA) 230-21 MCG/ACT inhaler Inhale 2 puffs into the lungs 2 (two) times daily. 1 each 5   montelukast (SINGULAIR) 10 MG tablet Take 10 mg by mouth daily.     No facility-administered medications prior to visit.    Review of Systems  Constitutional:  Negative for chills, diaphoresis, fever, malaise/fatigue and weight loss.  HENT:  Negative for congestion.   Respiratory:  Positive for wheezing. Negative for cough, hemoptysis, sputum production and shortness of breath.   Cardiovascular:  Negative for chest pain, palpitations and leg swelling.     Objective:   Vitals:   09/17/23 0906  BP: (!) 148/90  Pulse: 96  SpO2: 100%  Weight: 201 lb (91.2 kg)  Height: 5\' 11"  (1.803 m)   SpO2: 100 %  Physical Exam: General: Well-appearing, no acute distress HENT: [place], AT Eyes: EOMI, no scleral icterus Respiratory: Clear to auscultation bilaterally.  No crackles, wheezing or rales Cardiovascular: RRR, -M/R/G, no JVD Extremities:-Edema,-tenderness Neuro: AAO x4, CNII-XII grossly intact Psych: Normal mood, normal affect    Data Reviewed:  Imaging: CT chest 03/28/2022-(report only).  Impression: 1 mild central bronchial wall thickening with scattered areas of linear atelectasis. 2.  Small mediastinal lymph nodes, borderline enlarged.  Consider chest CT in 3 to 6 months to document stability. 3.  Multiple calcified noncalcified pulmonary nodules bilaterally.  Largest noncalcified is 5 mm.   Follow-up based on Fleischner criteria CT Chest 07/05/22 - No enlarged mediastinal lymph nodes. Unchanged subcentimeter nodules with largest 3mm  CXR 08/30/23 [facility] report only - Left basilar opacity could represent atelectasis or early pneumonia  PFT: None on file  Labs: CBC    Component Value Date/Time   WBC 13.3 (H) 04/28/2022 1152   RBC 4.26 04/28/2022 1152   HGB 12.7 04/28/2022 1152   HCT 38.4 04/28/2022 1152   PLT 266.0 04/28/2022 1152   MCV 90.3 04/28/2022 1152   MCH 30.5 08/26/2021 0119   MCHC 33.1 04/28/2022 1152   RDW 13.5 04/28/2022 1152   LYMPHSABS 2.9 04/28/2022 1152   MONOABS 1.0 04/28/2022 1152   EOSABS 0.7 04/28/2022 1152   BASOSABS 0.1 04/28/2022 1152   Absolute eos 04/28/22 700 IgE 04/28/22 192    Assessment &  Plan:   Discussion: 72 year old female with asthma, HTN, HLD, aortic stenosis who presents for follow-up. On [name] since 08/11/23 with good symptom control. Recent pneumonia but no exacerbations. If symptoms worsen especially since it is noticeable at 2 week mark, can consider switching to Dupixent in the future.  Prior inhalers: Intolerant to powder inhalers due to severe cough  Mild persistent asthma - improved/minimal symptoms on [name] CONTINUE monthly [name] 07/2022> CONTINUE Advair HFA 230-21 mcg TWO puffs TWICE a day. Refilled x 1 year CONTINUE Albuterol AS NEEDED for shortness of breath and wheezing. Refilled x 5 CONTINUE nebulizer AS NEEDED CONTINUE singulair 10 mg daily. Refilled x 1 year  Nasal congestion  CONTINUE flonase 1 spray per nightly as needed  Multiple subcentimeter pulmonary nodules - stabled since 2016 Borderline mediastinal lymphadenopathy - resolved --No further imaging indicated  Health Maintenance Immunization History  Administered Date(s) Administered   Fluad Quad(high Dose 65+) 10/25/2018, 09/09/2020, 09/28/2021   Fluad Trivalent(High Dose 65+) 10/25/2018   Influenza Split 10/30/2011, 11/19/2012   Influenza,  Seasonal, Injecte, Preservative Fre 02/10/2016   Influenza-Unspecified 02/10/2016   PFIZER(Purple Top)SARS-COV-2 Vaccination 02/23/2020, 03/21/2020   PNEUMOCOCCAL CONJUGATE-20 09/28/2021   Pneumococcal Conjugate-13 08/12/2014   Pneumococcal Polysaccharide-23 04/06/2016   Td 12/25/2000   Tdap 07/18/2010, 02/15/2021    No orders of the defined types were placed in this encounter.  Meds ordered this encounter  Medications   fluticasone-salmeterol (ADVAIR HFA) 230-21 MCG/ACT inhaler    Sig: Inhale 2 puffs into the lungs 2 (two) times daily.    Dispense:  1 each    Refill:  11   albuterol (VENTOLIN HFA) 108 (90 Base) MCG/ACT inhaler    Sig: Inhale 2 puffs into the lungs every 6 (six) hours as needed for wheezing or shortness of breath.    Dispense:  6.7 g    Refill:  5   montelukast (SINGULAIR) 10 MG tablet    Sig: Take 1 tablet (10 mg total) by mouth daily.    Dispense:  90 tablet    Refill:  3    Return in about 6 months (around 03/16/2024).   I have spent a total time of 32-minutes on the day of the appointment including chart review, data review, collecting history, coordinating care and discussing medical diagnosis and plan with the patient/family. Past medical history, allergies, medications were reviewed. Pertinent imaging, labs and tests included in this note have been reviewed and interpreted independently by me.  Etola Mull Mechele Collin, MD [facility] 09/17/2023  Office Number [phone]

## 2023-09-27 ENCOUNTER — Other Ambulatory Visit (HOSPITAL_COMMUNITY): Payer: Self-pay

## 2023-09-27 ENCOUNTER — Other Ambulatory Visit (HOSPITAL_COMMUNITY): Payer: Self-pay | Admitting: Pharmacy Technician

## 2023-09-27 ENCOUNTER — Other Ambulatory Visit: Payer: Self-pay | Admitting: Pulmonary Disease

## 2023-09-27 DIAGNOSIS — J4530 Mild persistent asthma, uncomplicated: Secondary | ICD-10-CM

## 2023-09-27 NOTE — Progress Notes (Signed)
Specialty Pharmacy Refill Coordination Note  Sherri Jones is a 72 y.o. female contacted today regarding refills of specialty medication(s) Mepolizumab   Patient requested Delivery   Delivery date: 10/05/23   Verified address: [address] [place], [place]   Medication will be filled on 10/04/23.  Refill Request sent to MD. Call if any delays.  Inject due on 10/11/23

## 2023-09-28 MED ORDER — NUCALA 100 MG/ML ~~LOC~~ SOAJ
100.0000 mg | SUBCUTANEOUS | 5 refills | Status: DC
  Filled 2023-10-01: qty 1, 28d supply, fill #0
  Filled 2023-10-29: qty 1, 28d supply, fill #1
  Filled 2023-11-29: qty 1, 28d supply, fill #2
  Filled 2023-12-31: qty 1, 28d supply, fill #3
  Filled 2024-02-04: qty 1, 28d supply, fill #4
  Filled 2024-03-04: qty 1, 28d supply, fill #5

## 2023-09-28 NOTE — Telephone Encounter (Signed)
Refill sent for [facility] to [facility]: [phone]   Dose: 100mg  SQ every 4 weeks  Last OV: 09/17/23 Provider: Dr. Everardo All  Next OV: 6 months (March 2025) - not yet scheduled  Routing to scheduling team for follow-up on appt scheduling  Chesley Mires, PharmD, MPH, BCPS Clinical Pharmacist (Rheumatology and Pulmonology)

## 2023-10-01 ENCOUNTER — Other Ambulatory Visit: Payer: Self-pay

## 2023-10-01 NOTE — Progress Notes (Signed)
Refill received

## 2023-10-22 ENCOUNTER — Ambulatory Visit: Payer: 59 | Attending: Cardiovascular Disease | Admitting: Cardiovascular Disease

## 2023-10-29 ENCOUNTER — Other Ambulatory Visit (HOSPITAL_COMMUNITY): Payer: Self-pay | Admitting: Pharmacy Technician

## 2023-10-29 ENCOUNTER — Other Ambulatory Visit (HOSPITAL_COMMUNITY): Payer: Self-pay

## 2023-10-29 NOTE — Progress Notes (Signed)
Specialty Pharmacy Refill Coordination Note  Sherri Jones is a 72 y.o. female contacted today regarding refills of specialty medication(s) Mepolizumab   Patient requested Delivery   Delivery date: 11/08/23   Verified address: [address]  [place]   Medication will be filled on 11/07/23.

## 2023-11-02 ENCOUNTER — Other Ambulatory Visit (HOSPITAL_BASED_OUTPATIENT_CLINIC_OR_DEPARTMENT_OTHER): Payer: Self-pay | Admitting: Pulmonary Disease

## 2023-11-02 NOTE — Telephone Encounter (Signed)
Refill request for Advair 230-21.  Per office note 09/17/2023 Dr Everardo All:  Sherri Jones Advair HFA 230-21 mcg TWO puffs TWICE a day. Refilled x 1 year   Ok refill.

## 2023-11-05 ENCOUNTER — Telehealth: Payer: Self-pay | Admitting: Pulmonary Disease

## 2023-11-05 NOTE — Telephone Encounter (Signed)
Marchelle Folks states patient's inhaler that will be covered starting January 2025 are Symbicort and Wixela. Marchelle Folks phone number is [phone].

## 2023-11-15 NOTE — Telephone Encounter (Signed)
I contacted patient regarding insurance coverage starting in January 2025. Patient prefers Symbicort. Currently has enough Advair HFA. She will contact office in January with updated insurance information and request for new prescription at the new year.

## 2023-11-29 ENCOUNTER — Other Ambulatory Visit: Payer: Self-pay

## 2023-11-29 NOTE — Progress Notes (Signed)
Specialty Pharmacy Ongoing Clinical Assessment Note  Sherri Jones is a 72 y.o. female who is being followed by the specialty pharmacy service for RxSp Asthma/COPD   Patient's specialty medication(s) reviewed today: Mepolizumab   Missed doses in the last 4 weeks: 0   Patient/Caregiver did not have any additional questions or concerns.   Therapeutic benefit summary: Patient is achieving benefit   Adverse events/side effects summary: No adverse events/side effects   Patient's therapy is appropriate to: Continue    Goals Addressed             This Visit's Progress    Stabilization of disease       Patient is on track. Patient will maintain adherence         Follow up:  6 months  Bobette Mo Specialty Pharmacist

## 2023-11-29 NOTE — Progress Notes (Signed)
Specialty Pharmacy Refill Coordination Note  Sherri Jones is a 72 y.o. female contacted today regarding refills of specialty medication(s) Mepolizumab   Patient requested Delivery   Delivery date: 12/06/23   Verified address: [address]   Medication will be filled on 12/05/23.   Next Injection due 12/11/23.

## 2023-12-05 ENCOUNTER — Other Ambulatory Visit: Payer: Self-pay

## 2023-12-17 ENCOUNTER — Ambulatory Visit: Payer: 59 | Attending: Cardiovascular Disease | Admitting: Cardiovascular Disease

## 2023-12-28 ENCOUNTER — Other Ambulatory Visit: Payer: Self-pay

## 2023-12-31 ENCOUNTER — Other Ambulatory Visit: Payer: Self-pay

## 2023-12-31 NOTE — Progress Notes (Signed)
 Specialty Pharmacy Refill Coordination Note  Sherri Jones is a 73 y.o. female contacted today regarding refills of specialty medication(s) Mepolizumab  ([name] )   Patient requested Delivery   Delivery date: 01/03/24   Verified address: [address] [place] [address]   Medication will be filled on 01/02/24.

## 2024-01-29 ENCOUNTER — Other Ambulatory Visit (HOSPITAL_COMMUNITY): Payer: Self-pay

## 2024-02-01 ENCOUNTER — Other Ambulatory Visit (HOSPITAL_COMMUNITY): Payer: Self-pay

## 2024-02-01 ENCOUNTER — Encounter (HOSPITAL_COMMUNITY): Payer: Self-pay

## 2024-02-04 ENCOUNTER — Other Ambulatory Visit: Payer: Self-pay

## 2024-02-04 ENCOUNTER — Other Ambulatory Visit (HOSPITAL_COMMUNITY): Payer: Self-pay

## 2024-02-04 NOTE — Progress Notes (Signed)
 Specialty Pharmacy Refill Coordination Note  Sherri Jones is a 73 y.o. female contacted today regarding refills of specialty medication(s) Mepolizumab  ([name] )   Patient requested Delivery   Delivery date: 02/05/24   Verified address: [address] [place],  [place] [address]   Medication will be filled on 02/04/24.

## 2024-02-10 ENCOUNTER — Other Ambulatory Visit (HOSPITAL_BASED_OUTPATIENT_CLINIC_OR_DEPARTMENT_OTHER): Payer: Self-pay | Admitting: Pulmonary Disease

## 2024-02-21 ENCOUNTER — Other Ambulatory Visit: Payer: Self-pay

## 2024-02-28 ENCOUNTER — Other Ambulatory Visit (HOSPITAL_COMMUNITY): Payer: Self-pay

## 2024-03-04 ENCOUNTER — Other Ambulatory Visit: Payer: Self-pay

## 2024-03-04 NOTE — Progress Notes (Signed)
 Specialty Pharmacy Refill Coordination Note  Sherri Jones is a 73 y.o. female contacted today regarding refills of specialty medication(s) Mepolizumab [name])   Patient requested Delivery   Delivery date: 03/06/24   Verified address: [address] [place],  [place] [address]   Medication will be filled on 03.12.25.

## 2024-03-05 ENCOUNTER — Encounter: Payer: Self-pay | Admitting: Cardiovascular Disease

## 2024-03-05 ENCOUNTER — Ambulatory Visit: Payer: 59 | Attending: Cardiovascular Disease | Admitting: Cardiovascular Disease

## 2024-03-05 VITALS — BP 134/86 | HR 80 | Ht 69.0 in | Wt 209.0 lb

## 2024-03-05 DIAGNOSIS — R0602 Shortness of breath: Secondary | ICD-10-CM

## 2024-03-05 DIAGNOSIS — E7800 Pure hypercholesterolemia, unspecified: Secondary | ICD-10-CM

## 2024-03-05 DIAGNOSIS — I700 Atherosclerosis of aorta: Secondary | ICD-10-CM

## 2024-03-05 DIAGNOSIS — R0789 Other chest pain: Secondary | ICD-10-CM

## 2024-03-05 DIAGNOSIS — I10 Essential (primary) hypertension: Secondary | ICD-10-CM

## 2024-03-05 NOTE — Patient Instructions (Signed)
 Medication Instructions:  No changes.  *If you need a refill on your cardiac medications before your next appointment, please call your pharmacy*  Testing/Procedures: Your physician has requested that you have an echocardiogram. Echocardiography is a painless test that uses sound waves to create images of your heart. It provides your doctor with information about the size and shape of your heart and how well your heart's chambers and valves are working. This procedure takes approximately one hour. There are no restrictions for this procedure. Please do NOT wear cologne, perfume, aftershave, or lotions (deodorant is allowed). Please arrive 15 minutes prior to your appointment time. [address].   Please note: We ask at that you not bring children with you during ultrasound (echo/ vascular) testing. Due to room size and safety concerns, children are not allowed in the ultrasound rooms during exams. Our front office staff cannot provide observation of children in our lobby area while testing is being conducted. An adult accompanying a patient to their appointment will only be allowed in the ultrasound room at the discretion of the ultrasound technician under special circumstances. We apologize for any inconvenience.    Follow-Up: At [facility], you and your health needs are our priority.  As part of our continuing mission to provide you with exceptional heart care, we have created designated Provider Care Teams.  These Care Teams include your primary Cardiologist (physician) and Advanced Practice Providers (APPs -  Physician Assistants and Nurse Practitioners) who all work together to provide you with the care you need, when you need it.  We recommend signing up for the patient portal called "MyChart".  Sign up information is provided on this After Visit Summary.  MyChart is used to connect with patients for Virtual Visits (Telemedicine).  Patients are able to view lab/test results,  encounter notes, upcoming appointments, etc.  Non-urgent messages can be sent to your provider as well.   To learn more about what you can do with MyChart, go to [email or web address].    Your next appointment:    As needed.   Provider:   Thurmon Fair, MD     Other Instructions

## 2024-03-05 NOTE — Progress Notes (Signed)
 Cardiology Office Note:    Date:  03/09/2024   ID:  Sherri Jones, DOB Apr 09, 1951, MRN [id number]  PCP:  [place], Sarah T, MD   [facility] Providers Cardiologist:  Thurmon Fair, MD     Referring MD: [place], Sarah T, MD   Chief Complaint  Patient presents with   Chest Pain    History of Present Illness:    Sherri Jones is a 73 y.o. female with a hx of hypertension, hypercholesterolemia, abdominal aortic atherosclerosis discovered incidentally on CT imaging, left shoulder arthropathy.  This is her first evaluation in our cardiology office since her treadmill stress test in 2019.  She has not really been concerned about her cardiac health until recently she had a pinching sensation in her left chest.  She was worried it was her heart, but it seems to be related to well-established left shoulder arthritis.  This did not occur during physical activity, but rather at rest.  If changes with modifying the position of her left arm.  She keeps busy with her grandchildren and is often very active, sometimes feels that she cannot keep up with them due to shortness of breath.  She denies edema, orthopnea, PND, syncope or palpitations.  She had a normal ECG treadmill stress test in 2019.  She exercised for 6.5 minutes and had a hypertensive response.  The study was interpreted as showing ischemia since there was 1 mm ST segment depression at peak exercise.  I disagree with that assessment.  There are some baseline nonspecific ST-T changes that become a little more obvious with exercise, but did not meet diagnostic criteria for ischemia.  At most, when compared to baseline there is a 0.5 mm degree of ST segment depression.    She told me she has not had an echocardiogram, but I later found the report of an echo performed at [facility] 09/20/2023 which shows normal LVEF, mild LVH with mild diastolic dysfunction" low to normal LA pressure".  There were no significant valvular  abnormalities.  She is on treatment with rosuvastatin.  Her most recent lipid profile from 08/29/2023 shows decent values with a total cholesterol 176, triglycerides 179, HDL 69, LDL 77.  She does not have diabetes mellitus.  Her most recent creatinine in February 05, 2024 was 0.82 and her hemoglobin was 11.8.  She was previously a Mudlogger.  Past Medical History:  Diagnosis Date   Chronic back pain    Hyperlipemia     History reviewed. No pertinent surgical history.  Current Medications: Current Meds  Medication Sig   albuterol (VENTOLIN HFA) 108 (90 Base) MCG/ACT inhaler INHALE 2 PUFFS INTO THE LUNGS EVERY 6 HOURS AS NEEDED FOR WHEEZING OR SHORTNESS OF BREATH   aspirin 81 MG tablet Take 81 mg by mouth daily.   ibuprofen (ADVIL) 800 MG tablet Take 800 mg by mouth as needed.   losartan (COZAAR) 100 MG tablet Take 100 mg by mouth daily.   Mepolizumab ([name]) 100 MG/ML SOAJ Inject 1 mL (100 mg total) into the skin every 28 (twenty-eight) days.   montelukast (SINGULAIR) 10 MG tablet Take 1 tablet (10 mg total) by mouth daily.   rosuvastatin (CRESTOR) 10 MG tablet Take 10 mg by mouth daily.   [DISCONTINUED] fluticasone-salmeterol (ADVAIR HFA) 230-21 MCG/ACT inhaler INHALE 2 PUFFS INTO THE LUNGS TWICE DAILY     Allergies:   Patient has no known allergies.   Family History: The patient's family history includes Cerebral aneurysm (age of onset:  81) in her mother; Heart attack (age of onset: 54) in her father.  ROS:   Please see the history of present illness.     All other systems reviewed and are negative.  EKGs/Labs/Other Studies Reviewed:    The following studies were reviewed today:  EKG Interpretation Date/Time:  Wednesday March 05 2024 11:29:28 EDT Ventricular Rate:  80 PR Interval:  158 QRS Duration:  76 QT Interval:  398 QTC Calculation: 459 R Axis:   -6  Text Interpretation: Normal sinus rhythm Low voltage QRS When compared with ECG of 24-Feb-2018 09:00,  QT is shorter, otherwise No significant change since last tracing Confirmed by Ame Heagle [id number]) on 03/05/2024 11:38:02 AM    Echocardiogram report of 09/20/2019 from [facility]  Left Ventricle: Left ventricle size is normal.   Left Ventricle: Systolic function is normal. EF: 55-60%.   Left Ventricle: There is mild concentric hypertrophy.   Right Ventricle: Right ventricle size is normal.   Right Ventricle: Systolic function is normal.   Tricuspid Valve: The right ventricular systolic pressure is normal (<36 mmHg).  Left Ventricle Left ventricle size is normal. There is mild concentric hypertrophy. Systolic function is normal. EF: 55-60%. Wall motion is normal. Doppler parameters consistent with mild diastolic dysfunction and low to normal LA pressure.   Recent Labs: No results found for requested labs within last 365 days.  Recent Lipid Panel No results found for: "CHOL", "TRIG", "HDL", "CHOLHDL", "VLDL", "LDLCALC", "LDLDIRECT" 08/29/2023  total cholesterol 176, triglycerides 179, HDL 69, LDL 77.  She does not have diabetes mellitus.    February 05, 2024  creatinine 0.82 and hemoglobin 11.8.  Risk Assessment/Calculations:           Physical Exam:    VS:  BP 134/86 (BP Location: Left Arm, Patient Position: Sitting, Cuff Size: Normal)   Pulse 80   Ht 5\' 9"  (1.753 m)   Wt 209 lb (94.8 kg)   SpO2 96%   BMI 30.86 kg/m     Wt Readings from Last 3 Encounters:  03/06/24 207 lb 11.2 oz (94.2 kg)  03/05/24 209 lb (94.8 kg)  09/17/23 201 lb (91.2 kg)     GEN: Borderline obese, well nourished, well developed in no acute distress HEENT: Normal NECK: No JVD; No carotid bruits LYMPHATICS: No lymphadenopathy CARDIAC: RRR, no murmurs, rubs, gallops RESPIRATORY:  Clear to auscultation without rales, wheezing or rhonchi  ABDOMEN: Soft, non-tender, non-distended MUSCULOSKELETAL:  No edema; No deformity  SKIN: Warm and dry NEUROLOGIC:  Alert and oriented x 3 PSYCHIATRIC:   Normal affect   ASSESSMENT:    1. SOB (shortness of breath)   2. Atypical chest pain   3. Aortic atherosclerosis (HCC)   4. Hypercholesterolemia   5. Essential hypertension    PLAN:    In order of problems listed above:  Shortness of breath: Some evidence of diastolic dysfunction previous study at outside facility which I cannot review directly.  Offered a follow-up echocardiogram to see if there are any signs of elevated filling pressures.  Shortness of breath may also be related to reactive airway disease.  She occasionally uses albuterol. Chest pain: Symptoms are highly atypical for angina.  They appear to be positional or related to her shoulder problems.  If assessment is necessary in the future, I would recommend against simple treadmill stress testing since this has produced equivocal findings in the past.  She would be better suited for a coronary CT angiogram or a PET scan. Aortic atherosclerosis:  Incidentally noted on CT of the abdomen.  No evidence of aneurysm. HLP: Adequate lipid parameters on rosuvastatin.  Continue HTN: Fair control, continue losartan.           Medication Adjustments/Labs and Tests Ordered: Current medicines are reviewed at length with the patient today.  Concerns regarding medicines are outlined above.  Orders Placed This Encounter  Procedures   EKG 12-Lead   ECHOCARDIOGRAM COMPLETE   No orders of the defined types were placed in this encounter.   Patient Instructions  Medication Instructions:  No changes. *If you need a refill on your cardiac medications before your next appointment, please call your pharmacy*   Testing/Procedures: Your physician has requested that you have an echocardiogram. Echocardiography is a painless test that uses sound waves to create images of your heart. It provides your doctor with information about the size and shape of your heart and how well your heart's chambers and valves are working. This procedure takes  approximately one hour. There are no restrictions for this procedure. Please do NOT wear cologne, perfume, aftershave, or lotions (deodorant is allowed). Please arrive 15 minutes prior to your appointment time.  [address].   Please note: We ask at that you not bring children with you during ultrasound (echo/ vascular) testing. Due to room size and safety concerns, children are not allowed in the ultrasound rooms during exams. Our front office staff cannot provide observation of children in our lobby area while testing is being conducted. An adult accompanying a patient to their appointment will only be allowed in the ultrasound room at the discretion of the ultrasound technician under special circumstances. We apologize for any inconvenience.    Follow-Up: At [facility], you and your health needs are our priority.  As part of our continuing mission to provide you with exceptional heart care, we have created designated Provider Care Teams.  These Care Teams include your primary Cardiologist (physician) and Advanced Practice Providers (APPs -  Physician Assistants and Nurse Practitioners) who all work together to provide you with the care you need, when you need it.  We recommend signing up for the patient portal called "MyChart".  Sign up information is provided on this After Visit Summary.  MyChart is used to connect with patients for Virtual Visits (Telemedicine).  Patients are able to view lab/test results, encounter notes, upcoming appointments, etc.  Non-urgent messages can be sent to your provider as well.   To learn more about what you can do with MyChart, go to [email or web address].    Your next appointment:    As needed.   Provider:   Thurmon Fair, MD     Other Instructions         Signed, Thurmon Fair, MD  03/09/2024 9:34 PM    [facility]

## 2024-03-06 ENCOUNTER — Telehealth (HOSPITAL_BASED_OUTPATIENT_CLINIC_OR_DEPARTMENT_OTHER): Payer: Self-pay | Admitting: Pulmonary Disease

## 2024-03-06 ENCOUNTER — Ambulatory Visit (INDEPENDENT_AMBULATORY_CARE_PROVIDER_SITE_OTHER): Payer: 59 | Admitting: Pulmonary Disease

## 2024-03-06 ENCOUNTER — Encounter (HOSPITAL_BASED_OUTPATIENT_CLINIC_OR_DEPARTMENT_OTHER): Payer: Self-pay | Admitting: Pulmonary Disease

## 2024-03-06 ENCOUNTER — Other Ambulatory Visit (HOSPITAL_COMMUNITY): Payer: Self-pay

## 2024-03-06 VITALS — BP 122/80 | HR 111 | Ht 69.0 in | Wt 207.7 lb

## 2024-03-06 DIAGNOSIS — J4530 Mild persistent asthma, uncomplicated: Secondary | ICD-10-CM | POA: Diagnosis not present

## 2024-03-06 MED ORDER — BUDESONIDE-FORMOTEROL FUMARATE 160-4.5 MCG/ACT IN AERO: 2.0000 | INHALATION_SPRAY | Freq: Two times a day (BID) | RESPIRATORY_TRACT | 11 refills | Status: AC

## 2024-03-06 MED ORDER — ALBUTEROL SULFATE (2.5 MG/3ML) 0.083% IN NEBU: 2.5000 mg | INHALATION_SOLUTION | Freq: Four times a day (QID) | RESPIRATORY_TRACT | 11 refills | Status: AC | PRN

## 2024-03-06 NOTE — Telephone Encounter (Signed)
 I spoke with pharmacy team after our clinic visit. Advair HFA not covered this year.  Brand name Symbicort 160-4.5 mcg is $0.00 copay. Will switch to this inhaler. Please notify patient of this change.

## 2024-03-06 NOTE — Progress Notes (Signed)
 Subjective:   PATIENT ID: Sherri Jones GENDER: female DOB: February 25, 1951, MRN: [id number]   HPI  Chief Complaint  Patient presents with   Follow-up    Asthma    Reason for Visit: Follow-up  Sherri Jones is a 73 year old female never smoker with asthma, multiple subcentimeter lung nodules, mild mediastinal adenopathy, hypertension, hyperlipidemia, diabetes and aortic atherosclerosis who presents for evaluation for asthma and wheezing, mediastinal lymphadenopathy pulmonary nodules. She presents with her sister.  Synopsis: She is followed by her PCP at [facility] [place].  Note from 03/29/2022 reviewed.  Has had persistent asthma symptoms despite using Trelegy.   Starting in September she had bronchitis and has had wheezing and chest congestion with sputum production. Shortness of breath with exertion. Symptoms worsen at night. Has nasal congestion. COVID and influenza neg. She has  No prior bronchitis but reports asthma in her 75s and used an albuterol inhaler and very used it except when exposed to smoke or irritants. Currently uses albuterol nebulizer once a day. Does not have handheld albuterol. Her symptoms have limited her activity including reducing distances.  She has tried Trelegy for 3 months but does not feel like it lasts.  05/15/22 Since her last visit she was transition from Trelegy to medium dose Advair. Has had difficult with the powder inhaler. It does have some benefit including improved shortness of breath and cough. Nighttime symptoms still are unchanged including wheezing and coughing. Continues to use albuterol twice during the day and once every nightly. Uses nebulizer daily and sometimes at night. She continues to have sinus congestion. Has been using afrin for years.  07/21/22 She switched to increased ICS inhaler and felt initially symptoms improved. However in the last week with the heat her wheezing with productive cough. Associated with shortness of breath.  She is using albuterol inhaler 1-3 times a day. Denies recent outpatient exacerbation or hospitalizations.  03/14/23 Since starting [name] she reports she has been doing well. Improved shortness of breath occurring 3-6 times a week but only needing nebulizer once a week. Will wake up once a week due to symptoms. She will start wheezing right before administration of the injection. Compliant with her Advair. No exacerbations requiring steroids since our last visit  09/17/23 Since our last visit she reports no exacerbations since our last visit but was treated for walking pneumonia with doxycycline earlier this month that was associated with a cough. Did not need steroids. Denies shortness of breath cough or wheezing. Does have mild wheeze sometimes when laying down which is improved with her rescue inhaler. This is more noticeable after two weeks. Winter is usually her worst time.  03/06/24 Since our last visit she was seen by [facility] 01/17/24 for acute bronchitis and treated for asthma exacerbation with prednisone taper. She has had some shortness of breath and was evaluated by Cardiology yesterday and scheduled for echocardiogram for work-up. Denies significant and wheezing at this time. She is using albuterol neb once a week. Concerned about the upcoming pollen season which usually triggers her. She is compliant with Advair.  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 She is #7  Past Medical History:  Diagnosis Date   Chronic  back pain    Hyperlipemia      Family History  Problem Relation Age of Onset   Cerebral aneurysm Mother 89   Heart attack Father 65       Died age 49     Social History   Occupational History   Not on file  Tobacco Use   Smoking status: Never   Smokeless tobacco: Never  Substance and Sexual Activity   Alcohol use: No   Drug use: Not on file   Sexual activity: Not on file    No Known Allergies   Outpatient Medications Prior to Visit  Medication Sig Dispense Refill   albuterol (VENTOLIN HFA) 108 (90 Base) MCG/ACT inhaler INHALE 2 PUFFS INTO THE LUNGS EVERY 6 HOURS AS NEEDED FOR WHEEZING OR SHORTNESS OF BREATH 6.7 g 5   aspirin 81 MG tablet Take 81 mg by mouth daily.     fluticasone-salmeterol (ADVAIR HFA) 230-21 MCG/ACT inhaler INHALE 2 PUFFS INTO THE LUNGS TWICE DAILY 12 g 10   ibuprofen (ADVIL) 800 MG tablet Take 800 mg by mouth as needed.     losartan (COZAAR) 100 MG tablet Take 100 mg by mouth daily.     Mepolizumab ([name]) 100 MG/ML SOAJ Inject 1 mL (100 mg total) into the skin every 28 (twenty-eight) days. 1 mL 5   montelukast (SINGULAIR) 10 MG tablet Take 1 tablet (10 mg total) by mouth daily. 90 tablet 3   rosuvastatin (CRESTOR) 10 MG tablet Take 10 mg by mouth daily.  5   No facility-administered medications prior to visit.    Review of Systems  Constitutional:  Negative for chills, diaphoresis, fever, malaise/fatigue and weight loss.  HENT:  Negative for congestion.   Respiratory:  Negative for cough, hemoptysis, sputum production, shortness of breath and wheezing.   Cardiovascular:  Negative for chest pain, palpitations and leg swelling.     Objective:   Vitals:   03/06/24 0843  BP: 122/80  Pulse: (!) 111  SpO2: 97%  Weight: 207 lb 11.2 oz (94.2 kg)  Height: 5\' 9"  (1.753 m)   SpO2: 97 %  Physical Exam: General: Well-appearing, no acute distress HENT: [place], AT Eyes: EOMI, no scleral icterus Respiratory: Clear to auscultation bilaterally.  No  crackles, wheezing or rales Cardiovascular: RRR, -M/R/G, no JVD Extremities:-Edema,-tenderness Neuro: AAO x4, CNII-XII grossly intact Psych: Normal mood, normal affect  Data Reviewed:  Imaging: CT chest 03/28/2022-(report only).  Impression: 1 mild central bronchial wall thickening with scattered areas of linear atelectasis. 2.  Small mediastinal lymph nodes, borderline enlarged.  Consider chest CT in 3 to 6 months to document stability. 3.  Multiple calcified noncalcified pulmonary nodules bilaterally.  Largest noncalcified is 5 mm.  Follow-up based on Fleischner criteria CT Chest 07/05/22 - No enlarged mediastinal lymph nodes. Unchanged subcentimeter nodules with largest 3mm  CXR 08/30/23 [facility] report only - Left basilar opacity could represent atelectasis or early pneumonia  PFT: None on file  Labs: CBC    Component Value Date/Time   WBC 13.3 (H) 04/28/2022 1152  RBC 4.26 04/28/2022 1152   HGB 12.7 04/28/2022 1152   HCT 38.4 04/28/2022 1152   PLT 266.0 04/28/2022 1152   MCV 90.3 04/28/2022 1152   MCH 30.5 08/26/2021 0119   MCHC 33.1 04/28/2022 1152   RDW 13.5 04/28/2022 1152   LYMPHSABS 2.9 04/28/2022 1152   MONOABS 1.0 04/28/2022 1152   EOSABS 0.7 04/28/2022 1152   BASOSABS 0.1 04/28/2022 1152   Absolute eos 04/28/22 700 IgE 04/28/22 192    Assessment & Plan:   Discussion: 73 year old female with asthma, HTN, HLD, aortic stenosis who presents for follow-up. On [name] since 08/11/23. Overall well controlled. Mild exacerbation in 12/2023. Now only having annual outpatient exacerbations.   Prior inhalers: Intolerant to powder inhalers due to severe cough  Mild persistent asthma - overall well controlled on current regimen CONTINUE monthly [name] 07/2022> CONTINUE Advair  230-21 mcg TWO puffs TWICE a day.  CONTINUE Albuterol AS NEEDED for shortness of breath and wheezing.  CONTINUE nebulizer AS NEEDED CONTINUE singulair 10 mg daily  Nasal congestion  CONTINUE flonase 1  spray per nightly as needed  Multiple subcentimeter pulmonary nodules - stabled since 2016 Borderline mediastinal lymphadenopathy - resolved --No further imaging indicated  Health Maintenance Immunization History  Administered Date(s) Administered   Fluad Quad(high Dose 65+) 10/25/2018, 09/09/2020, 09/28/2021   Fluad Trivalent(High Dose 65+) 10/25/2018   Influenza Split 10/30/2011, 11/19/2012   Influenza, Seasonal, Injecte, Preservative Fre 02/10/2016   Influenza-Unspecified 02/10/2016   PFIZER(Purple Top)SARS-COV-2 Vaccination 02/23/2020, 03/21/2020   PNEUMOCOCCAL CONJUGATE-20 09/28/2021   Pneumococcal Conjugate-13 08/12/2014   Pneumococcal Polysaccharide-23 04/06/2016   Td 12/25/2000   Tdap 07/18/2010, 02/15/2021    No orders of the defined types were placed in this encounter.  Meds ordered this encounter  Medications   albuterol (PROVENTIL) (2.5 MG/3ML) 0.083% nebulizer solution    Sig: Take 3 mLs (2.5 mg total) by nebulization every 6 (six) hours as needed for wheezing or shortness of breath.    Dispense:  75 mL    Refill:  11    Return in about 6 months (around 09/06/2024).   I have spent a total time of 30-minutes on the day of the appointment including chart review, data review, collecting history, coordinating care and discussing medical diagnosis and plan with the patient/family. Past medical history, allergies, medications were reviewed. Pertinent imaging, labs and tests included in this note have been reviewed and interpreted independently by me.  Mianna Iezzi Mechele Collin, MD [facility] 03/06/2024

## 2024-03-06 NOTE — Patient Instructions (Signed)
  Mild persistent asthma - overall well controlled on current regimen CONTINUE monthly [name] 07/2022> CONTINUE Advair  230-21 mcg TWO puffs TWICE a day.  CONTINUE Albuterol AS NEEDED for shortness of breath and wheezing.  CONTINUE nebulizer AS NEEDED CONTINUE singulair 10 mg daily

## 2024-03-06 NOTE — Telephone Encounter (Signed)
 Pt.notified

## 2024-03-09 ENCOUNTER — Encounter: Payer: Self-pay | Admitting: Cardiovascular Disease

## 2024-03-09 DIAGNOSIS — E7800 Pure hypercholesterolemia, unspecified: Secondary | ICD-10-CM | POA: Insufficient documentation

## 2024-03-09 DIAGNOSIS — I10 Essential (primary) hypertension: Secondary | ICD-10-CM | POA: Insufficient documentation

## 2024-03-26 ENCOUNTER — Other Ambulatory Visit: Payer: Self-pay

## 2024-03-31 ENCOUNTER — Other Ambulatory Visit: Payer: Self-pay

## 2024-03-31 ENCOUNTER — Other Ambulatory Visit (HOSPITAL_COMMUNITY): Payer: Self-pay

## 2024-03-31 ENCOUNTER — Other Ambulatory Visit: Payer: Self-pay | Admitting: Pulmonary Disease

## 2024-03-31 DIAGNOSIS — J4530 Mild persistent asthma, uncomplicated: Secondary | ICD-10-CM

## 2024-03-31 MED ORDER — NUCALA 100 MG/ML ~~LOC~~ SOAJ
100.0000 mg | SUBCUTANEOUS | 5 refills | Status: DC
  Filled 2024-03-31: qty 1, 28d supply, fill #0
  Filled 2024-05-02: qty 1, 28d supply, fill #1
  Filled 2024-05-27: qty 1, 28d supply, fill #2
  Filled 2024-07-04: qty 1, 28d supply, fill #3
  Filled 2024-07-30: qty 1, 28d supply, fill #4
  Filled 2024-08-29: qty 1, 28d supply, fill #5

## 2024-03-31 NOTE — Progress Notes (Signed)
 Specialty Pharmacy Refill Coordination Note  Sherri Jones is a 73 y.o. female contacted today regarding refills of specialty medication(s) Mepolizumab [name])   Patient requested Delivery   Delivery date: 04/03/24   Verified address: [address]    [place] [address]   Medication will be filled on 04/02/24.

## 2024-04-01 ENCOUNTER — Encounter: Payer: Self-pay | Admitting: Cardiovascular Disease

## 2024-04-01 ENCOUNTER — Ambulatory Visit (HOSPITAL_COMMUNITY): Attending: Cardiovascular Disease

## 2024-04-01 DIAGNOSIS — R0602 Shortness of breath: Secondary | ICD-10-CM | POA: Diagnosis present

## 2024-04-01 LAB — ECHOCARDIOGRAM COMPLETE
Area-P 1/2: 4.06 cm2
S' Lateral: 2.1 cm

## 2024-04-02 ENCOUNTER — Other Ambulatory Visit: Payer: Self-pay

## 2024-05-02 ENCOUNTER — Other Ambulatory Visit: Payer: Self-pay

## 2024-05-02 NOTE — Progress Notes (Signed)
 Specialty Pharmacy Refill Coordination Note  Sherri Jones is a 73 y.o. female contacted today regarding refills of specialty medication(s) Mepolizumab  ([name] )   Patient requested Delivery   Delivery date: 05/07/24   Verified address: [address] [place]   Medication will be filled on 05.13.25.

## 2024-05-27 ENCOUNTER — Other Ambulatory Visit: Payer: Self-pay

## 2024-05-27 NOTE — Progress Notes (Signed)
 Specialty Pharmacy Refill Coordination Note  Sherri Jones is a 73 y.o. female contacted today regarding refills of specialty medication(s) Mepolizumab  ([name] )   Patient requested Delivery   Delivery date: 06/06/24   Verified address: [address] [place]   Medication will be filled on 06/05/24.

## 2024-05-27 NOTE — Progress Notes (Signed)
 Specialty Pharmacy Ongoing Clinical Assessment Note  Sherri Jones is a 73 y.o. female who is being followed by the specialty pharmacy service for RxSp Asthma/COPD   Patient's specialty medication(s) reviewed today: Mepolizumab  ([name] )   Missed doses in the last 4 weeks: 0   Patient/Caregiver did not have any additional questions or concerns.   Therapeutic benefit summary: Patient is achieving benefit   Adverse events/side effects summary: No adverse events/side effects   Patient's therapy is appropriate to: Continue    Goals Addressed             This Visit's Progress    Stabilization of disease   On track    Patient is on track. Patient will maintain adherence         Follow up: 6 months  [name] M Jonanthony Nahar Specialty Pharmacist

## 2024-06-05 ENCOUNTER — Other Ambulatory Visit: Payer: Self-pay

## 2024-07-04 ENCOUNTER — Other Ambulatory Visit: Payer: Self-pay

## 2024-07-04 NOTE — Progress Notes (Signed)
 Specialty Pharmacy Refill Coordination Note  Sherri Jones is a 73 y.o. female contacted today regarding refills of specialty medication(s) Mepolizumab  ([name] )   Patient requested Delivery   Delivery date: 07/08/24   Verified address: [address] [place]   Medication will be filled on 07/07/24.

## 2024-07-30 ENCOUNTER — Other Ambulatory Visit: Payer: Self-pay

## 2024-07-30 NOTE — Progress Notes (Signed)
 Specialty Pharmacy Refill Coordination Note  Sherri Jones is a 73 y.o. female contacted today regarding refills of specialty medication(s) Mepolizumab  ([name] )   Patient requested Delivery   Delivery date: 08/07/24   Verified address: [address] [place]   Medication will be filled on 08.13.25.

## 2024-08-06 ENCOUNTER — Other Ambulatory Visit: Payer: Self-pay

## 2024-08-27 ENCOUNTER — Other Ambulatory Visit (HOSPITAL_COMMUNITY): Payer: Self-pay

## 2024-08-29 ENCOUNTER — Other Ambulatory Visit: Payer: Self-pay

## 2024-08-29 NOTE — Progress Notes (Signed)
 Specialty Pharmacy Refill Coordination Note  Sherri Jones is a 73 y.o. female contacted today regarding refills of specialty medication(s) Mepolizumab  ([name] )   Patient requested Delivery   Delivery date: 09/05/24   Verified address: [address] [place]   Medication will be filled on 09/04/24.

## 2024-09-09 ENCOUNTER — Ambulatory Visit (HOSPITAL_BASED_OUTPATIENT_CLINIC_OR_DEPARTMENT_OTHER): Admitting: Pulmonary Disease

## 2024-09-09 ENCOUNTER — Encounter (HOSPITAL_BASED_OUTPATIENT_CLINIC_OR_DEPARTMENT_OTHER): Payer: Self-pay | Admitting: Pulmonary Disease

## 2024-09-09 VITALS — BP 132/82 | HR 84 | Ht 69.0 in | Wt 206.6 lb

## 2024-09-09 DIAGNOSIS — J4550 Severe persistent asthma, uncomplicated: Secondary | ICD-10-CM | POA: Diagnosis not present

## 2024-09-09 MED ORDER — MONTELUKAST SODIUM 10 MG PO TABS: 10.0000 mg | ORAL_TABLET | Freq: Every day | ORAL | 3 refills | Status: DC

## 2024-09-09 NOTE — Progress Notes (Signed)
 Subjective:   PATIENT ID: Sherri Jones GENDER: female DOB: May 26, 1951, MRN: [id number]   HPI  Chief Complaint  Patient presents with   Asthma    Reason for Visit: Follow-up  [name] is a 73 year old female never smoker with asthma, multiple subcentimeter lung nodules, mild mediastinal adenopathy, hypertension, hyperlipidemia, diabetes and aortic atherosclerosis who presents for evaluation for asthma and wheezing, mediastinal lymphadenopathy pulmonary nodules. She presents with her sister.  Synopsis: She is followed by her PCP at [facility] [place].  Note from 03/29/2022 reviewed.  Has had persistent asthma symptoms despite using Trelegy.   Starting in September she had bronchitis and has had wheezing and chest congestion with sputum production. Shortness of breath with exertion. Symptoms worsen at night. Has nasal congestion. COVID and influenza neg. She has  No prior bronchitis but reports asthma in her 24s and used an albuterol  inhaler and very used it except when exposed to smoke or irritants. Currently uses albuterol  nebulizer once a day. Does not have handheld albuterol . Her symptoms have limited her activity including reducing distances.  She has tried Trelegy for 3 months but does not feel like it lasts.  05/15/22 Since her last visit she was transition from Trelegy to medium dose Advair. Has had difficult with the powder inhaler. It does have some benefit including improved shortness of breath and cough. Nighttime symptoms still are unchanged including wheezing and coughing. Continues to use albuterol  twice during the day and once every nightly. Uses nebulizer daily and sometimes at night. She continues to have sinus congestion. Has been using afrin for years.  07/21/22 She switched to increased ICS inhaler and felt initially symptoms improved. However in the last week with the heat her wheezing with productive cough. Associated with shortness of breath. She is using  albuterol  inhaler 1-3 times a day. Denies recent outpatient exacerbation or hospitalizations.  03/14/23 Since starting [name]  she reports she has been doing well. Improved shortness of breath occurring 3-6 times a week but only needing nebulizer once a week. Will wake up once a week due to symptoms. She will start wheezing right before administration of the injection. Compliant with her Advair. No exacerbations requiring steroids since our last visit  09/17/23 Since our last visit she reports no exacerbations since our last visit but was treated for walking pneumonia with doxycycline earlier this month that was associated with a cough. Did not need steroids. Denies shortness of breath cough or wheezing. Does have mild wheeze sometimes when laying down which is improved with her rescue inhaler. This is more noticeable after two weeks. Winter is usually her worst time.  03/06/24 Since our last visit she was seen by [facility] 01/17/24 for acute bronchitis and treated for asthma exacerbation with prednisone  taper. She has had some shortness of breath and was evaluated by Cardiology yesterday and scheduled for echocardiogram for work-up. Denies significant and wheezing at this time. She is using albuterol  neb once a week. Concerned about the upcoming pollen season which usually triggers her. She is compliant with Advair.  09/09/24 Since our last visit she does well on [name]  but experiencing breakthrough symptoms after two weeks of administration. She reports some wheezing for awhile that occurs when laying down. She uses albuterol  or nebulizer before bed sometimes and will help. Will have shortness of breath with talking and exertion. No exacerbations since last visit. Symbicort  is better than Advair but still having issues. On singulair  and [name]  which is due tomorrow.  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 She is #7  Past Medical History:  Diagnosis Date   Chronic back pain    Hyperlipemia      Family History  Problem Relation Age of Onset   Cerebral aneurysm Mother 32   Heart attack Father 65       Died age 77     Social History   Occupational History   Not on file  Tobacco Use   Smoking status: Never   Smokeless tobacco: Never  Substance and Sexual Activity   Alcohol use: No   Drug use: Not on file   Sexual activity: Not on file    No Known Allergies   Outpatient Medications Prior to Visit  Medication Sig Dispense Refill   albuterol  (PROVENTIL ) (2.5 MG/3ML) 0.083% nebulizer solution Take 3 mLs (2.5 mg total) by nebulization every 6 (six) hours as needed for wheezing or shortness of breath. 75 mL 11   albuterol  (VENTOLIN  HFA) 108 (90 Base) MCG/ACT inhaler INHALE 2 PUFFS INTO THE LUNGS EVERY 6 HOURS AS NEEDED FOR WHEEZING OR SHORTNESS OF BREATH 6.7 g 5   aspirin 81 MG tablet Take 81 mg by mouth daily.     budesonide -formoterol  (SYMBICORT ) 160-4.5 MCG/ACT inhaler Inhale 2 puffs into the lungs 2 (two) times daily. 10.2 g 11   ibuprofen  (ADVIL ) 800 MG tablet Take 800 mg by mouth as needed.     losartan (COZAAR) 100 MG tablet Take 100 mg by mouth daily.     Mepolizumab  ([name] ) 100 MG/ML SOAJ Inject 1 mL (100 mg total) into the skin every 28 (twenty-eight) days. 1 mL 5   rosuvastatin (CRESTOR) 10 MG tablet Take 10 mg by mouth daily.  5   montelukast  (SINGULAIR ) 10 MG tablet Take 1 tablet (10 mg total) by mouth daily. 90 tablet 3   No facility-administered medications prior to visit.    Review of  Systems  Constitutional:  Negative for chills, diaphoresis, fever, malaise/fatigue and weight loss.  HENT:  Negative for congestion.   Respiratory:  Positive for shortness of breath and wheezing. Negative for cough, hemoptysis and sputum production.   Cardiovascular:  Negative for chest pain, palpitations and leg swelling.     Objective:   Vitals:   09/09/24 1125  BP: 132/82  Pulse: 84  SpO2: 97%  Weight: 206 lb 9.6 oz (93.7 kg)  Height: 5' 9 (1.753 m)   SpO2: 97 %  Physical Exam: General: Well-appearing, no acute distress HENT: [place], AT Eyes: EOMI, no scleral icterus Respiratory: Clear to auscultation bilaterally.  No crackles, wheezing or rales Cardiovascular: RRR, -M/R/G, no JVD Extremities:-Edema,-tenderness Neuro: AAO x4, CNII-XII grossly intact Psych: Normal mood, normal affect  Data Reviewed:  Imaging:  CT chest 03/28/2022-(report only).  Impression: 1 mild central bronchial wall thickening with scattered areas of linear atelectasis. 2.  Small mediastinal lymph nodes, borderline enlarged.  Consider chest CT in 3 to 6 months to document stability. 3.  Multiple calcified noncalcified pulmonary nodules bilaterally.  Largest noncalcified is 5 mm.  Follow-up based on Fleischner criteria CT Chest 07/05/22 - No enlarged mediastinal lymph nodes. Unchanged subcentimeter nodules with largest 3mm CXR 08/30/23 [facility] report only - Left basilar opacity could represent atelectasis or early pneumonia  PFT: None on file  Labs: CBC    Component Value Date/Time   WBC 13.3 (H) 04/28/2022 1152   RBC 4.26 04/28/2022 1152   HGB 12.7 04/28/2022 1152   HCT 38.4 04/28/2022 1152   PLT 266.0 04/28/2022 1152   MCV 90.3 04/28/2022 1152   MCH 30.5 08/26/2021 0119   MCHC 33.1 04/28/2022 1152   RDW 13.5 04/28/2022 1152   LYMPHSABS 2.9 04/28/2022 1152   MONOABS 1.0 04/28/2022 1152   EOSABS 0.7 04/28/2022 1152   BASOSABS 0.1 04/28/2022 1152   Absolute eos 04/28/22 700 IgE 04/28/22 192     Assessment & Plan:   Discussion: 73 year old female with asthma, HTN, HLD, aortic stenosis who presents for follow-up. On [name]  since 08/10/22. Although she has had reduced annual exacerbations since starting, she reports breakthrough symptoms that affect her quality of life. Discussed biologic agents including [name]  (anti-IL-5) and Dupixent (anti-IL-4 receptor subunit alpha). She would be interested in pursuing Dupixent with the more frequent dosing. Will enroll once PFTs and labs are updated.  Prior inhalers: Intolerant to powder inhalers due to severe cough  Mild persistent asthma - Breakthrough symptoms at 2 weeks CONTINUE monthly [name]  since 07/2022. May consider transitioning to Dupixent at next visit CONTINUE Symbicort  160-4.5 mcg TWO puffs TWICE a day.  CONTINUE Albuterol  AS NEEDED for shortness of breath and wheezing.  CONTINUE nebulizer AS NEEDED CONTINUE singulair  10 mg daily ORDER pulmonary function test ORDER CBC with diff, IgE  Nasal congestion  CONTINUE flonase  1 spray per nightly as needed  Multiple subcentimeter pulmonary nodules - stabled since 2016 Borderline mediastinal lymphadenopathy - resolved --No further imaging indicated  Health Maintenance Immunization History  Administered Date(s) Administered   Fluad Quad(high Dose 65+) 10/25/2018, 09/09/2020, 09/28/2021   Fluad Trivalent(High Dose 65+) 10/25/2018   Influenza Split 10/30/2011, 11/19/2012   Influenza, Seasonal, Injecte, Preservative Fre 02/10/2016   Influenza-Unspecified 02/10/2016   PFIZER(Purple Top)SARS-COV-2 Vaccination 02/23/2020, 03/21/2020   PNEUMOCOCCAL CONJUGATE-20 09/28/2021   Pneumococcal Conjugate-13 08/12/2014   Pneumococcal Polysaccharide-23 04/06/2016   Td 12/25/2000   Tdap 07/18/2010, 02/15/2021    Orders Placed This Encounter  Procedures   CBC with Differential   IgE   Pulmonary function test    Where should this test be performed?:   Outpatient Pulmonary    What type of  PFT is being ordered?:   Full PFT   Meds ordered this encounter  Medications   montelukast  (SINGULAIR ) 10 MG tablet    Sig: Take 1 tablet (10 mg total) by mouth daily.    Dispense:  90 tablet    Refill:  3    Return for after PFT in December.   I have spent a total time of 31-minutes on the day of the appointment including chart review, data review, collecting history, coordinating care and discussing medical diagnosis and plan with the patient/family. Past medical history, allergies, medications were reviewed. Pertinent imaging, labs and tests included in this note have been reviewed and  interpreted independently by me.  Nestor Wieneke Slater Staff, MD [facility] 09/09/2024

## 2024-09-09 NOTE — Patient Instructions (Addendum)
 Mild persistent asthma - Breakthrough symptoms at 2 weeks CONTINUE monthly [name]  since 07/2022. May consider transitioning to Dupixent at next visit CONTINUE Symbicort  160-4.5 mcg TWO puffs TWICE a day.  CONTINUE Albuterol  AS NEEDED for shortness of breath and wheezing.  CONTINUE nebulizer AS NEEDED CONTINUE singulair  10 mg daily ORDER pulmonary function test ORDER CBC with diff, IgE

## 2024-09-11 LAB — CBC WITH DIFFERENTIAL/PLATELET
Basophils Absolute: 0.1 x10E3/uL (ref 0.0–0.2)
Basos: 1 %
EOS (ABSOLUTE): 0.1 x10E3/uL (ref 0.0–0.4)
Eos: 1 %
Hematocrit: 40.6 % (ref 34.0–46.6)
Hemoglobin: 13.5 g/dL (ref 11.1–15.9)
Immature Grans (Abs): 0 x10E3/uL (ref 0.0–0.1)
Immature Granulocytes: 0 %
Lymphocytes Absolute: 2.6 x10E3/uL (ref 0.7–3.1)
Lymphs: 35 %
MCH: 31.2 pg (ref 26.6–33.0)
MCHC: 33.3 g/dL (ref 31.5–35.7)
MCV: 94 fL (ref 79–97)
Monocytes Absolute: 0.7 x10E3/uL (ref 0.1–0.9)
Monocytes: 9 %
Neutrophils Absolute: 4.1 x10E3/uL (ref 1.4–7.0)
Neutrophils: 54 %
Platelets: 256 x10E3/uL (ref 150–450)
RBC: 4.33 x10E6/uL (ref 3.77–5.28)
RDW: 13.1 % (ref 11.7–15.4)
WBC: 7.6 x10E3/uL (ref 3.4–10.8)

## 2024-09-11 LAB — IGE: IgE (Immunoglobulin E), Serum: 120 [IU]/mL (ref 6–495)

## 2024-09-12 ENCOUNTER — Ambulatory Visit: Payer: Self-pay | Admitting: Pulmonary Disease

## 2024-09-25 ENCOUNTER — Other Ambulatory Visit: Payer: Self-pay

## 2024-09-25 ENCOUNTER — Other Ambulatory Visit: Payer: Self-pay | Admitting: Pulmonary Disease

## 2024-09-25 ENCOUNTER — Other Ambulatory Visit (HOSPITAL_COMMUNITY): Payer: Self-pay

## 2024-09-25 DIAGNOSIS — J4530 Mild persistent asthma, uncomplicated: Secondary | ICD-10-CM

## 2024-09-25 MED ORDER — NUCALA 100 MG/ML ~~LOC~~ SOAJ
100.0000 mg | SUBCUTANEOUS | 2 refills | Status: DC
  Filled 2024-09-25 – 2024-10-01 (×2): qty 1, 28d supply, fill #0
  Filled 2024-10-27: qty 1, 28d supply, fill #1
  Filled 2024-11-27: qty 1, 28d supply, fill #2

## 2024-09-25 NOTE — Telephone Encounter (Signed)
 Pt requesting refill of specialty medication - routing to Rx team to advise.

## 2024-09-25 NOTE — Telephone Encounter (Signed)
 Refill sent for [name]  to [facility]: [phone]   Dose: 100mg  [place] every 28 days   Last OV: 09/09/24 Provider: Dr. Kassie  Next OV: 12/09/24  Per last OV note with Dr. Kassie, may switch to Dupixent after next OV in December. Sending enough [name]  supply until next OV.   Aleck Puls, PharmD, BCPS Clinical Pharmacist  [facility]

## 2024-09-29 ENCOUNTER — Other Ambulatory Visit: Payer: Self-pay

## 2024-10-01 ENCOUNTER — Other Ambulatory Visit: Payer: Self-pay | Admitting: Pharmacy Technician

## 2024-10-01 ENCOUNTER — Other Ambulatory Visit: Payer: Self-pay

## 2024-10-01 NOTE — Progress Notes (Signed)
 Specialty Pharmacy Refill Coordination Note  MERRIAM BRANDNER is a 73 y.o. female contacted today regarding refills of specialty medication(s) Mepolizumab  ([name] )   Patient requested Delivery   Delivery date: 10/07/24   Verified address: [address] [place]   Medication will be filled on 10/06/24.

## 2024-10-06 ENCOUNTER — Other Ambulatory Visit: Payer: Self-pay

## 2024-10-16 ENCOUNTER — Other Ambulatory Visit: Payer: Self-pay

## 2024-10-27 ENCOUNTER — Other Ambulatory Visit (HOSPITAL_COMMUNITY): Payer: Self-pay

## 2024-10-29 ENCOUNTER — Other Ambulatory Visit: Payer: Self-pay

## 2024-10-29 NOTE — Progress Notes (Signed)
 Specialty Pharmacy Refill Coordination Note  Sherri Jones is a 73 y.o. female contacted today regarding refills of specialty medication(s) Mepolizumab  ([name] )   Patient requested Delivery   Delivery date: 11/07/24   Verified address: [address] [place] [address]   Medication will be filled on: 11/06/24

## 2024-11-06 ENCOUNTER — Other Ambulatory Visit: Payer: Self-pay

## 2024-11-27 ENCOUNTER — Other Ambulatory Visit: Payer: Self-pay

## 2024-12-01 ENCOUNTER — Other Ambulatory Visit: Payer: Self-pay

## 2024-12-01 NOTE — Progress Notes (Signed)
 Specialty Pharmacy Refill Coordination Note  Sherri Jones is a 73 y.o. female contacted today regarding refills of specialty medication(s) Mepolizumab  ([name] )   Patient requested Delivery   Delivery date: 12/05/24   Verified address: [address] [place] [address]   Medication will be filled on: 12/04/24

## 2024-12-04 ENCOUNTER — Other Ambulatory Visit: Payer: Self-pay

## 2024-12-08 ENCOUNTER — Encounter

## 2024-12-09 ENCOUNTER — Ambulatory Visit (HOSPITAL_BASED_OUTPATIENT_CLINIC_OR_DEPARTMENT_OTHER): Admitting: Pulmonary Disease

## 2024-12-16 ENCOUNTER — Ambulatory Visit (INDEPENDENT_AMBULATORY_CARE_PROVIDER_SITE_OTHER)

## 2024-12-16 DIAGNOSIS — J4550 Severe persistent asthma, uncomplicated: Secondary | ICD-10-CM

## 2024-12-16 LAB — PULMONARY FUNCTION TEST
DL/VA % pred: 122 %
DL/VA: 4.86 ml/min/mmHg/L
DLCO unc % pred: 98 %
DLCO unc: 22.45 ml/min/mmHg
FEF 25-75 Post: 2.42 L/s
FEF 25-75 Pre: 1.69 L/s
FEF2575-%Change-Post: 43 %
FEF2575-%Pred-Post: 112 %
FEF2575-%Pred-Pre: 78 %
FEV1-%Change-Post: 9 %
FEV1-%Pred-Post: 84 %
FEV1-%Pred-Pre: 77 %
FEV1-Post: 2.3 L
FEV1-Pre: 2.11 L
FEV1FVC-%Change-Post: 1 %
FEV1FVC-%Pred-Pre: 100 %
FEV6-%Change-Post: 7 %
FEV6-%Pred-Post: 86 %
FEV6-%Pred-Pre: 79 %
FEV6-Post: 2.97 L
FEV6-Pre: 2.76 L
FEV6FVC-%Pred-Post: 104 %
FEV6FVC-%Pred-Pre: 104 %
FVC-%Change-Post: 7 %
FVC-%Pred-Post: 82 %
FVC-%Pred-Pre: 76 %
FVC-Post: 2.97 L
FVC-Pre: 2.76 L
Post FEV1/FVC ratio: 77 %
Post FEV6/FVC ratio: 100 %
Pre FEV1/FVC ratio: 76 %
Pre FEV6/FVC Ratio: 100 %
RV % pred: 95 %
RV: 2.36 L
TLC % pred: 89 %
TLC: 5.18 L

## 2024-12-16 NOTE — Progress Notes (Signed)
 Full PFT performed today.

## 2024-12-16 NOTE — Patient Instructions (Signed)
 Full PFT performed today.

## 2024-12-24 ENCOUNTER — Ambulatory Visit (HOSPITAL_BASED_OUTPATIENT_CLINIC_OR_DEPARTMENT_OTHER): Admitting: Pulmonary Disease

## 2024-12-24 ENCOUNTER — Other Ambulatory Visit: Payer: Self-pay | Admitting: Pulmonary Disease

## 2024-12-24 ENCOUNTER — Other Ambulatory Visit (HOSPITAL_COMMUNITY): Payer: Self-pay

## 2024-12-24 ENCOUNTER — Encounter (HOSPITAL_BASED_OUTPATIENT_CLINIC_OR_DEPARTMENT_OTHER): Payer: Self-pay | Admitting: Pulmonary Disease

## 2024-12-24 VITALS — BP 122/69 | HR 97 | Temp 98.0°F | Ht 69.0 in | Wt 210.9 lb

## 2024-12-24 DIAGNOSIS — J4530 Mild persistent asthma, uncomplicated: Secondary | ICD-10-CM

## 2024-12-24 DIAGNOSIS — J4550 Severe persistent asthma, uncomplicated: Secondary | ICD-10-CM

## 2024-12-24 DIAGNOSIS — R918 Other nonspecific abnormal finding of lung field: Secondary | ICD-10-CM

## 2024-12-24 MED ORDER — MONTELUKAST SODIUM 10 MG PO TABS: 10.0000 mg | ORAL_TABLET | Freq: Every day | ORAL | 3 refills | Status: AC

## 2024-12-24 NOTE — Progress Notes (Unsigned)
 "   Subjective:   PATIENT ID: Sherri Jones GENDER: female DOB: 02/03/1951, MRN: [id number]   HPI  Chief Complaint  Patient presents with   Asthma    Reason for Visit: Follow-up  [name] is a 73 year old female never smoker with asthma, multiple subcentimeter lung nodules, mild mediastinal adenopathy, hypertension, hyperlipidemia, diabetes and aortic atherosclerosis who presents for evaluation for asthma and wheezing, mediastinal lymphadenopathy pulmonary nodules. She presents with her sister.  Synopsis: She is followed by her PCP at [facility] [place].  Note from 03/29/2022 reviewed.  Has had persistent asthma symptoms despite using Trelegy.   Starting in September she had bronchitis and has had wheezing and chest congestion with sputum production. Shortness of breath with exertion. Symptoms worsen at night. Has nasal congestion. COVID and influenza neg. She has  No prior bronchitis but reports asthma in her 54s and used an albuterol  inhaler and very used it except when exposed to smoke or irritants. Currently uses albuterol  nebulizer once a day. Does not have handheld albuterol . Her symptoms have limited her activity including reducing distances.  She has tried Trelegy for 3 months but does not feel like it lasts.  05/15/22 Since her last visit she was transition from Trelegy to medium dose Advair. Has had difficult with the powder inhaler. It does have some benefit including improved shortness of breath and cough. Nighttime symptoms still are unchanged including wheezing and coughing. Continues to use albuterol  twice during the day and once every nightly. Uses nebulizer daily and sometimes at night. She continues to have sinus congestion. Has been using afrin for years.  07/21/22 She switched to increased ICS inhaler and felt initially symptoms improved. However in the last week with the heat her wheezing with productive cough. Associated with shortness of breath. She is using  albuterol  inhaler 1-3 times a day. Denies recent outpatient exacerbation or hospitalizations.  03/14/23 Since starting [name]  she reports she has been doing well. Improved shortness of breath occurring 3-6 times a week but only needing nebulizer once a week. Will wake up once a week due to symptoms. She will start wheezing right before administration of the injection. Compliant with her Advair. No exacerbations requiring steroids since our last visit  09/17/23 Since our last visit she reports no exacerbations since our last visit but was treated for walking pneumonia with doxycycline earlier this month that was associated with a cough. Did not need steroids. Denies shortness of breath cough or wheezing. Does have mild wheeze sometimes when laying down which is improved with her rescue inhaler. This is more noticeable after two weeks. Winter is usually her worst time.  03/06/24 Since our last visit she was seen by [facility] 01/17/24 for acute bronchitis and treated for asthma exacerbation with prednisone  taper. She has had some shortness of breath and was evaluated by Cardiology yesterday and scheduled for echocardiogram for work-up. Denies significant and wheezing at this time. She is using albuterol  neb once a week. Concerned about the upcoming pollen season which usually triggers her. She is compliant with Advair.  09/09/24 Since our last visit she does well on [name]  but experiencing breakthrough symptoms after two weeks of administration. She reports some wheezing for awhile that occurs when laying down. She uses albuterol  or nebulizer before bed sometimes and will help. Will have shortness of breath with talking and exertion. No exacerbations since last visit. Symbicort  is better than Advair but still having issues. On singulair  and [name]  which is due  tomorrow.  12/24/24 She reports wheezing has improved with nebulizer twice a day in addition to her symbicort . Shortness of breath of breath  has improved as well. No exacerbations since last appointment. Compliance with Singulair  and [name] . She does aerobics with her grandchildren.  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 She is #7  Past Medical History:  Diagnosis Date   Chronic back pain    Hyperlipemia      Family History  Problem Relation Age of Onset   Cerebral aneurysm Mother 56   Heart attack Father 61       Died age 58     Social History   Occupational History   Not on file  Tobacco Use   Smoking status: Never   Smokeless tobacco: Never  Substance and Sexual Activity   Alcohol use: No   Drug use: Not on file   Sexual activity: Not on file    No Known Allergies   Outpatient Medications Prior to Visit  Medication Sig Dispense Refill   albuterol  (PROVENTIL ) (2.5 MG/3ML) 0.083% nebulizer solution Take 3 mLs (2.5 mg total) by nebulization every 6 (six) hours as needed for wheezing or shortness of breath. 75 mL 11   albuterol  (VENTOLIN  HFA) 108 (90 Base) MCG/ACT inhaler INHALE 2 PUFFS INTO THE LUNGS EVERY 6 HOURS AS NEEDED FOR WHEEZING OR SHORTNESS OF BREATH 6.7 g 5   aspirin 81 MG tablet Take 81 mg by mouth daily.     budesonide -formoterol  (SYMBICORT ) 160-4.5 MCG/ACT inhaler Inhale 2 puffs into the lungs 2 (two) times daily. 10.2 g 11   ibuprofen  (ADVIL ) 800 MG tablet Take 800 mg by mouth as needed.     losartan (COZAAR) 100 MG tablet Take 100 mg by mouth daily.     Mepolizumab  ([name] ) 100 MG/ML SOAJ Inject 1 mL (100 mg total) into the skin  every 28 (twenty-eight) days. 1 mL 2   rosuvastatin (CRESTOR) 10 MG tablet Take 10 mg by mouth daily.  5   montelukast  (SINGULAIR ) 10 MG tablet Take 1 tablet (10 mg total) by mouth daily. 90 tablet 3   No facility-administered medications prior to visit.    Review of Systems  Constitutional:  Negative for chills, diaphoresis, fever, malaise/fatigue and weight loss.  HENT:  Negative for congestion.   Respiratory:  Positive for shortness of breath. Negative for cough, hemoptysis, sputum production and wheezing.   Cardiovascular:  Negative for chest pain, palpitations and leg swelling.     Objective:   Vitals:   12/24/24 1111  BP: 122/69  Pulse: 97  Temp: 98 F (36.7 C)  SpO2: 99%  Weight: 210 lb 14.4 oz (95.7 kg)  Height: 5' 9 (1.753 m)   SpO2: 99 %  Physical Exam: General: Well-appearing, no acute distress HENT: [place], AT Eyes: EOMI, no scleral icterus Respiratory: ***Clear to auscultation bilaterally.  No crackles, wheezing or rales Cardiovascular: RRR, -M/R/G, no JVD Extremities:-Edema,-tenderness Neuro: AAO x4, CNII-XII grossly intact Psych: Normal mood, normal affect   Data Reviewed:  Imaging: CT chest 03/28/2022-(report only).  Impression: 1 mild central bronchial wall thickening with scattered areas of linear atelectasis. 2.  Small mediastinal lymph nodes, borderline enlarged.  Consider chest CT in 3 to 6 months to document stability. 3.  Multiple calcified noncalcified pulmonary nodules bilaterally.  Largest noncalcified is 5 mm.  Follow-up based on Fleischner criteria CT Chest 07/05/22 - No enlarged mediastinal lymph nodes. Unchanged subcentimeter nodules with largest 3mm CXR 08/30/23 [facility] report only - Left basilar opacity could represent atelectasis or early pneumonia  PFT: 12/16/24 FVC 2.97 (82%) FEV1 2.30 (84%) Ratio 77  TLC 89% DLCO 98% Interpretation: Normal pulmonary function tests  Labs: CBC    Component Value Date/Time   WBC 7.6 09/09/2024 1154    WBC 13.3 (H) 04/28/2022 1152   RBC 4.33 09/09/2024 1154   RBC 4.26 04/28/2022 1152   HGB 13.5 09/09/2024 1154   HCT 40.6 09/09/2024 1154   PLT 256 09/09/2024 1154   MCV 94 09/09/2024 1154   MCH 31.2 09/09/2024 1154   MCH 30.5 08/26/2021 0119   MCHC 33.3 09/09/2024 1154   MCHC 33.1 04/28/2022 1152   RDW 13.1 09/09/2024 1154   LYMPHSABS 2.6 09/09/2024 1154   MONOABS 1.0 04/28/2022 1152   EOSABS 0.1 09/09/2024 1154   BASOSABS 0.1 09/09/2024 1154   Absolute eos 04/28/22 700 IgE 04/28/22 192    Assessment & Plan:   Discussion: 73 year old female with asthma, HTN, HLD, aortic stenosis who presents for follow-up. On [name]  since 08/10/22. Although she has had reduced annual exacerbations since starting, she reports breakthrough symptoms that affect her quality of life. Discussed biologic agents including [name]  (anti-IL-5) and Dupixent (anti-IL-4 receptor subunit alpha). She would be interested in pursuing Dupixent with the more frequent dosing. Will enroll once PFTs and labs are updated.  Prior inhalers: Intolerant to powder inhalers due to severe cough  Mild persistent asthma - Improved control CONTINUE monthly [name]  since 07/2022. May consider transitioning to Dupixent at next visit CONTINUE Symbicort  160-4.5 mcg TWO puffs TWICE a day.  CONTINUE Albuterol  AS NEEDED for shortness of breath and wheezing.  CONTINUE nebulizer twice a day and AS NEEDED CONTINUE singulair  10 mg daily. REFILL Reviewed pulmonary function test  Nasal congestion  CONTINUE flonase  1 spray per nightly as needed  Multiple subcentimeter pulmonary nodules - stabled since 2016 Borderline mediastinal lymphadenopathy - resolved --No further imaging indicated  Health Maintenance Immunization History  Administered Date(s) Administered   Fluad Quad(high Dose 65+) 10/25/2018, 09/09/2020, 09/28/2021   Fluad Trivalent(High Dose 65+) 10/25/2018   Influenza Split 10/30/2011, 11/19/2012   Influenza, Seasonal, Injecte,  Preservative Fre 02/10/2016   Influenza-Unspecified 02/10/2016   PFIZER(Purple Top)SARS-COV-2 Vaccination 02/23/2020, 03/21/2020   PNEUMOCOCCAL CONJUGATE-20 09/28/2021   Pneumococcal Conjugate-13 08/12/2014   Pneumococcal Polysaccharide-23 04/06/2016   Td 12/25/2000   Tdap 07/18/2010, 02/15/2021    No orders of the defined types were placed in this encounter.  Meds ordered this encounter  Medications   montelukast  (SINGULAIR ) 10 MG tablet    Sig: Take 1 tablet (10 mg total) by mouth daily.    Dispense:  90 tablet    Refill:  3    Return in about 3 months (around 03/24/2025).   I  have spent a total time of***-minutes on the day of the appointment including chart review, data review, collecting history, coordinating care and discussing medical diagnosis and plan with the patient/family. Past medical history, allergies, medications were reviewed. Pertinent imaging, labs and tests included in this note have been reviewed and interpreted independently by me.  Tianne Plott Slater Staff, MD [facility] 12/24/2024    "

## 2024-12-26 ENCOUNTER — Other Ambulatory Visit (HOSPITAL_COMMUNITY): Payer: Self-pay

## 2024-12-26 ENCOUNTER — Other Ambulatory Visit: Payer: Self-pay

## 2024-12-26 MED ORDER — NUCALA 100 MG/ML ~~LOC~~ SOAJ
100.0000 mg | SUBCUTANEOUS | 2 refills | Status: AC
  Filled 2024-12-26 – 2024-12-30 (×3): qty 1, 28d supply, fill #0

## 2024-12-26 NOTE — Telephone Encounter (Signed)
 Refill sent for [name]  to [name]: [phone]   Dose: 100mg  [place] every 28 days   Last OV: 12/24/24 Provider: Dr. Kassie  Next OV: due March 2026 - ** of note, considering switch to Dupixent at that time per last OV note 12/24/24 **  Sherri Jones, PharmD, BCPS Clinical Pharmacist  [facility]

## 2024-12-29 ENCOUNTER — Ambulatory Visit (HOSPITAL_BASED_OUTPATIENT_CLINIC_OR_DEPARTMENT_OTHER): Admitting: Pulmonary Disease

## 2024-12-30 ENCOUNTER — Other Ambulatory Visit: Payer: Self-pay

## 2024-12-30 NOTE — Progress Notes (Signed)
 Specialty Pharmacy Refill Coordination Note  Sherri Jones is a 74 y.o. female contacted today regarding refills of specialty medication(s) Mepolizumab  ([name] )   Patient requested Delivery   Delivery date: 01/06/25   Verified address: [address] [place] [address]   Medication will be filled on: 01/05/25

## 2025-01-05 ENCOUNTER — Other Ambulatory Visit: Payer: Self-pay

## 2025-01-05 ENCOUNTER — Other Ambulatory Visit (HOSPITAL_COMMUNITY): Payer: Self-pay

## 2025-01-08 ENCOUNTER — Other Ambulatory Visit (HOSPITAL_COMMUNITY): Payer: Self-pay

## 2025-01-09 ENCOUNTER — Other Ambulatory Visit: Payer: Self-pay

## 2025-01-12 ENCOUNTER — Telehealth (HOSPITAL_BASED_OUTPATIENT_CLINIC_OR_DEPARTMENT_OTHER): Payer: Self-pay

## 2025-01-12 ENCOUNTER — Other Ambulatory Visit (HOSPITAL_COMMUNITY): Payer: Self-pay

## 2025-01-12 NOTE — Telephone Encounter (Unsigned)
 Copied from CRM [id number]. Topic: Clinical - Prescription Issue >> Jan 12, 2025 11:01 AM Joesph PARAS wrote: Reason for CRM: Patient is calling to state that she has not been able to get her Mepolizumab  ([name] ) 100 MG/ML SOAJ. She has called [facility] several times and they continue to state they will call her back but never do. System says first fill in progress and patient would like Dr. Venessa to resolve this, as she is now late on taking her medication.

## 2025-03-19 ENCOUNTER — Ambulatory Visit (HOSPITAL_BASED_OUTPATIENT_CLINIC_OR_DEPARTMENT_OTHER): Admitting: Pulmonary Disease
